# Patient Record
Sex: Female | Born: 1975 | Race: Black or African American | Hispanic: No | Marital: Married | Smoking: Current every day smoker
Health system: Southern US, Community
[De-identification: ages and names within clinical notes are randomized; demographics above are authoritative.]

## PROBLEM LIST (undated history)

## (undated) DIAGNOSIS — Z3046 Encounter for surveillance of implantable subdermal contraceptive: Secondary | ICD-10-CM

## (undated) DIAGNOSIS — Z319 Encounter for procreative management, unspecified: Secondary | ICD-10-CM

## (undated) DIAGNOSIS — K219 Gastro-esophageal reflux disease without esophagitis: Secondary | ICD-10-CM

## (undated) DIAGNOSIS — R87619 Unspecified abnormal cytological findings in specimens from cervix uteri: Secondary | ICD-10-CM

## (undated) DIAGNOSIS — I10 Essential (primary) hypertension: Secondary | ICD-10-CM

## (undated) HISTORY — DX: Unspecified abnormal cytological findings in specimens from cervix uteri: R87.619

## (undated) HISTORY — PX: NO PAST SURGERIES: SHX2092

## (undated) HISTORY — DX: Encounter for surveillance of implantable subdermal contraceptive: Z30.46

## (undated) HISTORY — DX: Gastro-esophageal reflux disease without esophagitis: K21.9

## (undated) HISTORY — DX: Encounter for procreative management, unspecified: Z31.9

---

## 2000-12-07 ENCOUNTER — Other Ambulatory Visit: Admission: RE | Admit: 2000-12-07 | Discharge: 2000-12-07 | Payer: Self-pay | Admitting: Obstetrics and Gynecology

## 2001-01-20 ENCOUNTER — Emergency Department (HOSPITAL_COMMUNITY): Admission: EM | Admit: 2001-01-20 | Discharge: 2001-01-20 | Payer: Self-pay | Admitting: Emergency Medicine

## 2001-07-06 ENCOUNTER — Encounter: Payer: Self-pay | Admitting: Family Medicine

## 2001-07-06 ENCOUNTER — Ambulatory Visit (HOSPITAL_COMMUNITY): Admission: RE | Admit: 2001-07-06 | Discharge: 2001-07-06 | Payer: Self-pay | Admitting: Family Medicine

## 2002-07-05 ENCOUNTER — Ambulatory Visit (HOSPITAL_COMMUNITY): Admission: RE | Admit: 2002-07-05 | Discharge: 2002-07-05 | Payer: Self-pay | Admitting: Unknown Physician Specialty

## 2002-07-05 ENCOUNTER — Encounter: Payer: Self-pay | Admitting: Family Medicine

## 2003-01-16 ENCOUNTER — Emergency Department (HOSPITAL_COMMUNITY): Admission: EM | Admit: 2003-01-16 | Discharge: 2003-01-16 | Payer: Self-pay | Admitting: Emergency Medicine

## 2003-01-30 ENCOUNTER — Emergency Department (HOSPITAL_COMMUNITY): Admission: EM | Admit: 2003-01-30 | Discharge: 2003-01-30 | Payer: Self-pay | Admitting: *Deleted

## 2004-06-18 ENCOUNTER — Emergency Department (HOSPITAL_COMMUNITY): Admission: EM | Admit: 2004-06-18 | Discharge: 2004-06-18 | Payer: Self-pay | Admitting: Emergency Medicine

## 2004-06-26 ENCOUNTER — Emergency Department (HOSPITAL_COMMUNITY): Admission: EM | Admit: 2004-06-26 | Discharge: 2004-06-26 | Payer: Self-pay | Admitting: Emergency Medicine

## 2004-09-18 ENCOUNTER — Ambulatory Visit: Payer: Self-pay | Admitting: Family Medicine

## 2005-11-11 ENCOUNTER — Emergency Department (HOSPITAL_COMMUNITY): Admission: EM | Admit: 2005-11-11 | Discharge: 2005-11-11 | Payer: Self-pay | Admitting: Emergency Medicine

## 2006-07-21 ENCOUNTER — Emergency Department (HOSPITAL_COMMUNITY): Admission: EM | Admit: 2006-07-21 | Discharge: 2006-07-21 | Payer: Self-pay | Admitting: Emergency Medicine

## 2006-09-17 ENCOUNTER — Ambulatory Visit (HOSPITAL_COMMUNITY): Admission: AD | Admit: 2006-09-17 | Discharge: 2006-09-17 | Payer: Self-pay | Admitting: Obstetrics and Gynecology

## 2006-12-23 ENCOUNTER — Ambulatory Visit (HOSPITAL_COMMUNITY): Admission: AD | Admit: 2006-12-23 | Discharge: 2006-12-23 | Payer: Self-pay | Admitting: Obstetrics and Gynecology

## 2007-01-11 ENCOUNTER — Ambulatory Visit (HOSPITAL_COMMUNITY): Admission: AD | Admit: 2007-01-11 | Discharge: 2007-01-11 | Payer: Self-pay | Admitting: Obstetrics & Gynecology

## 2007-01-26 ENCOUNTER — Encounter: Payer: Self-pay | Admitting: Obstetrics and Gynecology

## 2007-01-26 ENCOUNTER — Inpatient Hospital Stay (HOSPITAL_COMMUNITY): Admission: RE | Admit: 2007-01-26 | Discharge: 2007-01-27 | Payer: Self-pay | Admitting: Obstetrics and Gynecology

## 2007-06-17 ENCOUNTER — Ambulatory Visit: Payer: Self-pay | Admitting: Family Medicine

## 2007-11-02 ENCOUNTER — Ambulatory Visit: Payer: Self-pay | Admitting: Family Medicine

## 2007-11-05 ENCOUNTER — Ambulatory Visit (HOSPITAL_COMMUNITY): Admission: RE | Admit: 2007-11-05 | Discharge: 2007-11-05 | Payer: Self-pay | Admitting: Family Medicine

## 2007-11-05 ENCOUNTER — Encounter: Payer: Self-pay | Admitting: Family Medicine

## 2007-11-12 DIAGNOSIS — J309 Allergic rhinitis, unspecified: Secondary | ICD-10-CM | POA: Insufficient documentation

## 2007-11-12 DIAGNOSIS — M549 Dorsalgia, unspecified: Secondary | ICD-10-CM | POA: Insufficient documentation

## 2007-11-12 DIAGNOSIS — M25559 Pain in unspecified hip: Secondary | ICD-10-CM

## 2008-01-09 ENCOUNTER — Emergency Department (HOSPITAL_COMMUNITY): Admission: EM | Admit: 2008-01-09 | Discharge: 2008-01-09 | Payer: Self-pay | Admitting: Emergency Medicine

## 2009-02-06 ENCOUNTER — Ambulatory Visit: Payer: Self-pay | Admitting: Family Medicine

## 2009-02-08 LAB — CONVERTED CEMR LAB
BUN: 11 mg/dL (ref 6–23)
Basophils Absolute: 0 10*3/uL (ref 0.0–0.1)
Basophils Relative: 0 % (ref 0–1)
CO2: 18 meq/L — ABNORMAL LOW (ref 19–32)
Calcium: 9.6 mg/dL (ref 8.4–10.5)
Chloride: 102 meq/L (ref 96–112)
Cholesterol: 223 mg/dL — ABNORMAL HIGH (ref 0–200)
Creatinine, Ser: 0.87 mg/dL (ref 0.40–1.20)
Eosinophils Absolute: 0.3 10*3/uL (ref 0.0–0.7)
Eosinophils Relative: 3 % (ref 0–5)
Glucose, Bld: 72 mg/dL (ref 70–99)
HCT: 40.2 % (ref 36.0–46.0)
HDL: 65 mg/dL (ref >39)
Helicobacter Pylori Antibody-IgG: 1.1 — ABNORMAL HIGH
Hemoglobin: 13.6 g/dL (ref 12.0–15.0)
LDL Cholesterol: 148 mg/dL — ABNORMAL HIGH (ref 0–99)
Lymphocytes Relative: 34 % (ref 12–46)
Lymphs Abs: 2.6 10*3/uL (ref 0.7–4.0)
MCHC: 33.8 g/dL (ref 30.0–36.0)
MCV: 95.9 fL (ref 78.0–100.0)
Monocytes Absolute: 0.7 10*3/uL (ref 0.1–1.0)
Monocytes Relative: 9 % (ref 3–12)
Neutro Abs: 4.1 10*3/uL (ref 1.7–7.7)
Neutrophils Relative %: 53 % (ref 43–77)
Platelets: 332 10*3/uL (ref 150–400)
Potassium: 3.9 meq/L (ref 3.5–5.3)
RBC: 4.19 M/uL (ref 3.87–5.11)
RDW: 12.3 % (ref 11.5–15.5)
Sodium: 139 meq/L (ref 135–145)
TSH: 1.837 microintl units/mL (ref 0.350–4.500)
Total CHOL/HDL Ratio: 3.4
Triglycerides: 50 mg/dL (ref <150)
VLDL: 10 mg/dL (ref 0–40)
WBC: 7.6 10*3/uL (ref 4.0–10.5)

## 2009-02-14 ENCOUNTER — Encounter: Payer: Self-pay | Admitting: Family Medicine

## 2009-06-06 ENCOUNTER — Telehealth: Payer: Self-pay | Admitting: Family Medicine

## 2009-09-05 ENCOUNTER — Ambulatory Visit: Payer: Self-pay | Admitting: Family Medicine

## 2009-09-05 ENCOUNTER — Encounter (INDEPENDENT_AMBULATORY_CARE_PROVIDER_SITE_OTHER): Payer: Self-pay

## 2009-09-05 DIAGNOSIS — J209 Acute bronchitis, unspecified: Secondary | ICD-10-CM

## 2009-09-12 ENCOUNTER — Encounter: Payer: Self-pay | Admitting: Family Medicine

## 2009-12-07 ENCOUNTER — Encounter: Payer: Self-pay | Admitting: Family Medicine

## 2009-12-07 ENCOUNTER — Emergency Department (HOSPITAL_COMMUNITY): Admission: EM | Admit: 2009-12-07 | Discharge: 2009-12-07 | Payer: Self-pay | Admitting: Emergency Medicine

## 2010-09-07 ENCOUNTER — Encounter: Payer: Self-pay | Admitting: Family Medicine

## 2010-09-17 NOTE — Letter (Signed)
Summary: Out of Work  [facility]  [address]   [place], [place] [address]   Phone: [phone]  Fax: [phone]    September 05, 2009   Employee:  [name]    To Whom It May Concern:   For Medical reasons, please excuse the above named employee from work for the following dates:  Start:   09/05/2009  End:   09/07/2009  If you need additional information, please feel free to contact our office.         Sincerely,    Milus Mallick. Lodema Hong, M.D.

## 2010-09-17 NOTE — Progress Notes (Signed)
Summary: OBGYN  OBGYN   Imported By: Lind Guest 12/12/2009 16:36:06  _____________________________________________________________________  External Attachment:    Type:   Image     Comment:   External Document

## 2010-09-17 NOTE — Assessment & Plan Note (Signed)
Summary: office visit   Vital Signs:  Patient profile:   35 year old female Menstrual status:  regular Height:      66 inches Weight:      163.25 pounds BMI:     26.44 O2 Sat:      96 % Pulse rate:   91 / minute Pulse rhythm:   regular Resp:     16 per minute BP sitting:   110 / 70  Vitals Entered By: Everitt Amber (September 05, 2009 9:55 AM)  Nutrition Counseling: Patient's BMI is greater than 25 and therefore counseled on weight management options. CC: Coughing and now her chest is sore. Coughing up yellowish phlegm. Has been taking Mucinex and theraflu for 2 days but no better   CC:  Coughing and now her chest is sore. Coughing up yellowish phlegm. Has been taking Mucinex and theraflu for 2 days but no better.  History of Present Illness: Cough and chest congestion x  6 days, green sputum, and chills .  Smoking half pack per day.No quit date as yet. Has not been able to take prevpac due to cost. Prior to her acute illness she had been doing well.Reports  that they are doing well. . Denies sinus pressure, nasal congestion , ear pain or sore throat.  Denies chest pain, palpitations, PND, orthopnea or leg swelling. Denies nausea, vomitting, diarrhea or constipation. Denies change in bowel movements or bloody stool. Denies dysuria , frequency, incontinence or hesitancy. Denies  joint pain, swelling, or reduced mobility. Denies headaches, vertigo, seizures. Denies depression, anxiety or insomnia. Denies  rash, lesions, or itch.     Preventive Screening-Counseling & Management  Alcohol-Tobacco     Smoking Cessation Counseling: yes  Current Medications (verified): 1)  None  Allergies (verified): 1)  ! Sulfa  Review of Systems General:  Complains of chills, fatigue, and fever. ENT:  Denies hoarseness, nasal congestion, postnasal drainage, sinus pressure, and sore throat. Resp:  Complains of cough and sputum productive. GI:  Complains of abdominal pain; denies  constipation, diarrhea, nausea, and vomiting; untreated H pylori ds, meds were too expensive will prescribe lower cost alternative.  Physical Exam  General:  Well-developed,well-nourished,in no acute distress; alert,appropriate and cooperative throughout examination. Ill appearing HEENT: No facial asymmetry,  EOMI, No sinus tenderness, TM's Clear, oropharynx  pink and moist.   Chest: decreased air entry, bilateral crackles CVS: S1, S2, No murmurs, No S3.   Abd: Soft, Nontender.  MS: Adequate ROM spine, hips, shoulders and knees.  Ext: No edema.   CNS: CN 2-12 intact, power tone and sensation normal throughout.   Skin: Intact, no visible lesions or rashes.  Psych: Good eye contact, normal affect.  Memory intact, not anxious or depressed appearing.    Impression & Recommendations:  Problem # 1:  ACUTE BRONCHITIS (ICD-466.0) Assessment Comment Only  The following medications were removed from the medication list:    Flagyl 250 Mg Tabs (Metronidazole) .Marland Kitchen... Take 1 tablet by mouth four times a day    Tetracycline Hcl 500 Mg Caps (Tetracycline hcl) .Marland Kitchen... Take 1 tablet by mouth four times a day Her updated medication list for this problem includes:    Veetids 500 Mg Tabs (Penicillin v potassium) .Marland Kitchen... Take 1 tablet by mouth three times a day    Tessalon Perles 100 Mg Caps (Benzonatate) .Marland Kitchen... Take 1 tablet by mouth three times a day  Orders: Rocephin  250mg  (Z3086) Admin of Therapeutic Inj  intramuscular or subcutaneous (57846)  Problem # 2:  NICOTINE ADDICTION (ICD-305.1) Assessment: Unchanged  Encouraged smoking cessation and discussed different methods for smoking cessation.   Complete Medication List: 1)  Veetids 500 Mg Tabs (Penicillin v potassium) .... Take 1 tablet by mouth three times a day 2)  Tessalon Perles 100 Mg Caps (Benzonatate) .... Take 1 tablet by mouth three times a day 3)  Ibuprofen 800 Mg Tabs (Ibuprofen) .... Take 1 tablet by mouth three times a day 4)   Prevacid 30 Mg Cpdr (Lansoprazole) .... Take 1 tablet by mouth two times a day x 2 weeks  Patient Instructions: 1)  F/U in 5 months 2)  Tobacco is very bad for your health and your loved ones! You Should stop smoking!. 3)  Stop Smoking Tips: Choose a Quit date. Cut down before the Quit date. decide what you will do as a substitute when you feel the urge to smoke(gum,toothpick,exercise). 4)  It is important that you exercise regularly at least 20 minutes 5 times a week. If you develop chest pain, have severe difficulty breathing, or feel very tired , stop exercising immediately and seek medical attention. 5)  You need to lose weight. Consider a lower calorie diet and regular exercise.  6)  You are being treated for acute bronchirtis and h pylori disease. Meds are sent in Prescriptions: PREVACID 30 MG CPDR (LANSOPRAZOLE) Take 1 tablet by mouth two times a day x 2 weeks  #18 x 0   Entered and Authorized by:   Syliva Overman MD   Signed by:   Syliva Overman MD on 09/10/2009   Method used:   Samples Given   RxID:   [id number] IBUPROFEN 800 MG TABS (IBUPROFEN) Take 1 tablet by mouth three times a day  #90 x 2   Entered by:   Everitt Amber   Authorized by:   Syliva Overman MD   Signed by:   Everitt Amber on 09/05/2009   Method used:   [facility] [address] [phone]* (retail)       [address]       [place], [place]  [address]       Ph: [phone] or [phone]       Fax: [phone]   RxID:   [id number] TESSALON PERLES 100 MG CAPS (BENZONATATE) Take 1 tablet by mouth three times a day  #30 x 0   Entered and Authorized by:   Syliva Overman MD   Signed by:   Syliva Overman MD on 09/05/2009   Method used:   [facility]ronically to        [facility] [phone]* (retail)       [address]       [place], [place]  [address]       Ph: [phone] or [phone]       Fax: [phone]   RxID:   [id number] VEETIDS 500 MG TABS  (PENICILLIN V POTASSIUM) Take 1 tablet by mouth three times a day  #42 x 0   Entered and Authorized by:   Syliva Overman MD   Signed by:   Syliva Overman MD on 09/05/2009   Method used:   Electronically to        [facility] [phone]* (retail)       [address]       [place], [place]  [address]       Ph: [phone] or [phone]       Fax: [phone]   RxID:   [id number]    Medication Administration  Injection # 1:    Medication: Rocephin  250mg     Diagnosis: ACUTE BRONCHITIS (ICD-466.0)    Route: IM    Site: RUOQ gluteus    Exp Date: 04/2011    Lot #: [id number]    Mfr: sandoz    Comments: 500 mg given     Patient tolerated injection without complications    Given by: Everitt Amber (September 05, 2009 12:48 PM)  Orders Added: 1)  Est. Patient Level IV [59563] 2)  Rocephin  250mg  [J0696] 3)  Admin of Therapeutic Inj  intramuscular or subcutaneous [87564]

## 2010-09-17 NOTE — Miscellaneous (Signed)
Clinical Lists Changes  Medications: Added new medication of FLAGYL 250 MG TABS (METRONIDAZOLE) one tab by mouth qid - Signed Added new medication of TETRACYCLINE HCL 500 MG CAPS (TETRACYCLINE HCL) once cap by mouth qid - Signed Added new medication of BISMUTH SUBGALLATE 324 MG TABS (BISMUTH SUBGALLATE) two tabs by mouth qid - Signed Added new medication of RANITIDINE HCL 150 MG TABS (RANITIDINE HCL) one tab by mouth bid - Signed Rx of FLAGYL 250 MG TABS (METRONIDAZOLE) one tab by mouth qid;  #56 x 0;  Signed;  Entered by: Worthy Keeler LPN;  Authorized by: Syliva Overman MD;  Method used: Electronically to [facility] [phone]*, [address] [place], [place], [place]  [address], Ph: [phone] or [phone], Fax: [phone] Rx of TETRACYCLINE HCL 500 MG CAPS (TETRACYCLINE HCL) once cap by mouth qid;  #56 x 0;  Signed;  Entered by: Worthy Keeler LPN;  Authorized by: Syliva Overman MD;  Method used: Electronically to [facility] [phone]*, [address] [place], [place], [place]  [address], Ph: [phone] or [phone], Fax: [phone] Rx of BISMUTH SUBGALLATE 324 MG TABS (BISMUTH SUBGALLATE) two tabs by mouth qid;  #112 x 0;  Signed;  Entered by: Worthy Keeler LPN;  Authorized by: Syliva Overman MD;  Method used: Electronically to [facility] [phone]*, [address], [place], [place], [place]  [address], Ph: [phone] or [phone], Fax: [phone] Rx of RANITIDINE HCL 150 MG TABS (RANITIDINE HCL) one tab by mouth bid;  #60 x 0;  Signed;  Entered by: Worthy Keeler LPN;  Authorized by: Syliva Overman MD;  Method used: Electronically to [facility] [phone]*, [address], [place], [place], [place]  [address], Ph: [phone] or [phone], Fax: [phone]    Prescriptions: RANITIDINE HCL 150 MG TABS (RANITIDINE HCL) one tab by mouth bid  #60 x 0   Entered by:   Worthy Keeler LPN   Authorized by:   Syliva Overman MD   Signed by:   Worthy Keeler LPN on 06/11/8526   Method  used:   [facility]ronically [facility]        [facility] [address] [phone]* (retail)       [address]       [place], [place]  [address]       Ph: [phone] or [phone]       Fax: [phone]   RxID:   [id number] BISMUTH SUBGALLATE 324 MG TABS (BISMUTH SUBGALLATE) two tabs by mouth qid  #112 x 0   Entered by:   Worthy Keeler LPN   Authorized by:   Syliva Overman MD   Signed by:   Worthy Keeler LPN on 93/79/0240   Method used:   [facility]ronically to        [facility] [address] [phone]* (retail)       [address]       [place], [place]  [address]       Ph: [phone] or [phone]       Fax: [phone]   RxID:   [id number] TETRACYCLINE HCL 500 MG CAPS (TETRACYCLINE HCL) once cap by mouth qid  #56 x 0   Entered by:   Worthy Keeler LPN   Authorized by:   Syliva Overman MD   Signed by:   Worthy Keeler LPN on 58/85/0277   Method used:   Electronically to  [facility]  [address] [phone]* (retail)       [address]       [place], [place]  [address]       Ph: [phone] or [phone]       Fax: [phone]   RxID:   [id number] FLAGYL 250 MG TABS (METRONIDAZOLE) one tab by mouth qid  #56 x 0   Entered by:   Worthy Keeler LPN   Authorized by:   Syliva Overman MD   Signed by:   Worthy Keeler LPN on 95/63/8756   Method used:   Electronically to        [facility]. [phone]* (retail)       [address]       [place], [place]  [address]       Ph: [phone] or [phone]       Fax: [phone]   RxID:   [id number]

## 2010-12-31 NOTE — Op Note (Signed)
[name], [name]                ACCOUNT NO.:  [id number]   MEDICAL RECORD NO.:  [id number]          PATIENT TYPE:  INP   LOCATION:  LDR3                          FACILITY:  [facility]   PHYSICIAN:  Tilda Burrow, M.D. DATE OF BIRTH:  1976-04-24   DATE OF PROCEDURE:  DATE OF DISCHARGE:                                PROCEDURE NOTE   DELIVERY SUMMARY   ONSET OF LABOR:  January 26, 2007, at 4 a.m.   DATE OF DELIVERY:  January 26, 2007, at 6:50 a.m.   LENGTH OF FIRST STAGE OF LABOR:  4 hours 40 minutes   LENGTH OF SECOND STAGE OF LABOR:  10 minutes   LENGTH OF THIRD STAGE OF LABOR:  5 minutes   DELIVERY NOTE:  [name] had a normal spontaneous vaginal delivery.  Upon  rupture of membranes prior to delivery there was light meconium noted.  Upon delivery of head, attempted to DeLee suction but mother was  uncooperative and there was spontaneous delivery of fetus without  complication.  Apgars were 9 and 9.  At that time the infant was  thoroughly suctioned with the DeLee, cord clamped and cut, and infant to  newborn warmer for nursery care.  Perineum is noted to be intact.  Cord  blood gas and cord blood were obtained.  Placenta was delivered  spontaneously via Schultze mechanism.  Three-vessel cord is noted.  Estimated blood loss, again, 250 mL.  The patient and infant stabilized  and transferred out to the postpartum unit in stable condition.      Zerita Boers, [name]      Tilda Burrow, M.D.  Electronically Signed   DL/MEDQ  D:  16/05/9603  T:  01/26/2007  Job:  [id number]   cc:   Jeoffrey Massed, MD  Fax: [phone]   [facility]

## 2010-12-31 NOTE — H&P (Signed)
[name], RAMAKER                ACCOUNT NO.:  [id number]   MEDICAL RECORD NO.:  [id number]          PATIENT TYPE:  INP   LOCATION:  LDR3                          FACILITY:  [facility]   PHYSICIAN:  Tilda Burrow, M.D. DATE OF BIRTH:  March 30, 1976   DATE OF ADMISSION:  01/26/2007  DATE OF DISCHARGE:  LH                              HISTORY & PHYSICAL   REASON FOR ADMISSION:  Pregnancy at 38 weeks, in active labor.   MEDICAL HISTORY:  Negative.   SURGICAL HISTORY:  Negative.   ALLERGIES:  She has no known allergies.   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  She also has a  history of cleft lip in a previous pregnancy.  Blood type is B negative.  UDS positive for THC.  Rubella is equivocal.  Hepatitis B surface  antigen negative.  HIV is negative.  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  Serology  nonreactive on both draws.  GC and chlamydia positive with repeat  negative at 28 weeks.  Hemoglobin 9.8 at 28 weeks, hematocrit 28.7,  which responded well to iron therapy, and her hemoglobin came up to 11.5  and hematocrit 33.  One-hour glucose was 147.   PLAN:  We are going to admit.  Expect vaginal delivery since is 7-8 cm  in labor.      Zerita Boers, [name]      Tilda Burrow, M.D.  Electronically Signed    DL/MEDQ  D:  16/05/9603  T:  01/26/2007  Job:  [id number]   cc:   Jeoffrey Massed, MD  Fax: [phone]

## 2011-01-03 NOTE — H&P (Signed)
[name], [name]                ACCOUNT NO.:  [id number]   MEDICAL RECORD NO.:  [id number]          PATIENT TYPE:  OIB   LOCATION:  A415                          FACILITY:  [facility]   PHYSICIAN:  Tilda Burrow, M.D. DATE OF BIRTH:  Aug 08, 1976   DATE OF ADMISSION:  12/23/2006  DATE OF DISCHARGE:  LH                              HISTORY & PHYSICAL   ADMITTING DIAGNOSES:  1. Pregnancy [redacted] weeks gestation.  2. Uterine irritability rule out preterm labor   HISTORY OF PRESENT ILLNESS:  This 35 year old female gravida 4, para 2,  AB 1 due June 24 by menstrual history; and June 18 and June 22 by first  and second trimester ultrasounds was admitted after her pregnancy course  follow through our office.  She is working approximately 20 to 30 hours  per week at [employer].  She has been having some abdominal discomfort  while at work; leading in the gush of fluid.  This morning she woke up  having some mild-to-moderate discomfort and presents to labor and  delivery where external monitor shows a regular uterine pattern of  contractions of every four minutes a mild-to- moderate intensity with  cervical exam showing the cervix still to remain closed.  We started IV  fluids and we will give her a single dose of terbutaline subcu to knock  out the contractions and see how she does through the morning.  The  urine drug screen is performed and is negative for opiates, cocaine,  benzodiazepine, and amphetamines, remains constant for THC.  Urinalysis  is pending at this time.  She denies UTI symptoms.  Plan is for  observation, IV fluid hydration, and consideration of discharge home of  contractions resolve promptly.  Will likely require being out of work  for the remainder of the we will follow up appointment next Monday, at  which time we can discuss further work restrictions.  The patient  desires to work this possible.   ASSESSMENT:  Pregnancy at 33 weeks, preterm labor with no cervical  changes.   PLAN:  See orders.      Tilda Burrow, M.D.  Electronically Signed     JVF/MEDQ  D:  12/23/2006  T:  12/23/2006  Job:  [id number]

## 2011-01-03 NOTE — Group Therapy Note (Signed)
NAMEANELISE, [name]                ACCOUNT NO.:  [id number]   MEDICAL RECORD NO.:  [id number]          PATIENT TYPE:  OIB   LOCATION:  LDR3                          FACILITY:  [facility]   PHYSICIAN:  Tilda Burrow, M.D. DATE OF BIRTH:  1975/12/20   DATE OF PROCEDURE:  DATE OF DISCHARGE:                                 PROGRESS NOTE   [name] is [redacted] weeks pregnant with her third child.  She was seen in the  office today with complaints of some vaginal bleeding.  At that time she  did not have any bleeding in her vagina or signs of infection per Dr.  _eure___ .  She went shopping and started having bleeding again.  It was  enough to soak through her jeans.  A sterile speculum exam reveals a  moderate amount of bright red to dark blood in the vaginal vault.  The  cervix looks just fine.  No evidence of infection.  Dr. Emelda Fear  performed a BEDSIDE TRANSVAGINAL ULTRASOUND   which revealed an anterior low-lying placenta WITHOUT evidence of  retroplacental clots, but revealing a marginal just above the internal  os , most likely the source of the bleeding. The cervix is closed with a  4.6 cm cervical length.   The fetus is very active and no uterine activity is noted.  The patient  is going to receive a dose of RhoGAM due to her B-  Status and she was discharged home.  She is instructed to refrain from  sexual activity until further notice and if she starts bleeding again to  rest, and if she has  bleeding equal to or more than a period to follow  up at labor and delivery or our office.      Jacklyn Shell, C.N.M.      Tilda Burrow, M.D.  Electronically Signed    FC/MEDQ  D:  09/17/2006  T:  09/17/2006  Job:  [id number]

## 2011-01-03 NOTE — Discharge Summary (Signed)
NAMEGENEVIE, ELMAN                ACCOUNT NO.:  [id number]   MEDICAL RECORD NO.:  [id number]          PATIENT TYPE:  OIB   LOCATION:  A415                          FACILITY:  [facility]   PHYSICIAN:  Tilda Burrow, M.D. DATE OF BIRTH:  12-Oct-1975   DATE OF ADMISSION:  12/23/2006  DATE OF DISCHARGE:  05/07/2008LH                               DISCHARGE SUMMARY   DATE OF ADMISSION:  Dec 23, 2006, at 8:30.   DATE OF DISCHARGE:  Dec 23, 2006, at 12:30 p.m.   After observation for approximately 5-6 hours, Lucy is being  discharged home.  Her abdominal pain has resolved with hydration. She  was dehydrated with evaluation of her UA.  With hydration, the abdominal  pain has resolved.  Cervix is closed, long, posterior, and high.   PLAN:  We are going to discharge her home.  She is to follow up p.r.n.  or in the office Friday.   ADMISSION DIAGNOSES:  1. Preterm contractions.  2. Dehydration.   DISCHARGE DIAGNOSES:  1. Preterm contractions.  2. Dehydration.      Zerita Boers, [name]      Tilda Burrow, M.D.  Electronically Signed    DL/MEDQ  D:  45/40/9811  T:  12/23/2006  Job:  [id number]   cc:   [facility]

## 2011-06-05 LAB — URINALYSIS, ROUTINE W REFLEX MICROSCOPIC
Bilirubin Urine: NEGATIVE
Glucose, UA: NEGATIVE
Hgb urine dipstick: NEGATIVE
Ketones, ur: NEGATIVE
Nitrite: NEGATIVE
Protein, ur: NEGATIVE
Specific Gravity, Urine: 1.02
Urobilinogen, UA: 2 — ABNORMAL HIGH
pH: 6.5

## 2011-06-05 LAB — RAPID URINE DRUG SCREEN, HOSP PERFORMED
Benzodiazepines: NOT DETECTED
Cocaine: NOT DETECTED
Tetrahydrocannabinol: POSITIVE — AB

## 2011-06-05 LAB — CBC
HCT: 33.2 — ABNORMAL LOW
Hemoglobin: 11.6 — ABNORMAL LOW
MCHC: 35
MCV: 94.8
RBC: 3.51 — ABNORMAL LOW
RDW: 12.9

## 2011-06-05 LAB — CORD BLOOD GAS (ARTERIAL)
Acid-base deficit: 0.9
Bicarbonate: 24.1 — ABNORMAL HIGH
TCO2: 21.5
pCO2 cord blood (arterial): 46
pH cord blood (arterial): 7.339
pO2 cord blood: 27.2

## 2011-06-05 LAB — DIFFERENTIAL
Basophils Absolute: 0
Eosinophils Relative: 1
Lymphocytes Relative: 26
Neutro Abs: 6.9
Neutrophils Relative %: 68

## 2011-06-05 LAB — RH IMMUNE GLOB WKUP(>/=20WKS)(NOT WOMEN'S HOSP)
Antibody Screen: POSITIVE
DAT, IgG: NEGATIVE
Fetal Screen: NEGATIVE

## 2012-09-17 ENCOUNTER — Emergency Department (HOSPITAL_COMMUNITY): Payer: Self-pay

## 2012-09-17 ENCOUNTER — Encounter (HOSPITAL_COMMUNITY): Payer: Self-pay | Admitting: *Deleted

## 2012-09-17 ENCOUNTER — Emergency Department (HOSPITAL_COMMUNITY)
Admission: EM | Admit: 2012-09-17 | Discharge: 2012-09-17 | Disposition: A | Discharge status: Home / Self Care | Payer: Self-pay | Attending: Emergency Medicine | Admitting: Emergency Medicine

## 2012-09-17 DIAGNOSIS — M1610 Unilateral primary osteoarthritis, unspecified hip: Secondary | ICD-10-CM | POA: Insufficient documentation

## 2012-09-17 DIAGNOSIS — M169 Osteoarthritis of hip, unspecified: Secondary | ICD-10-CM | POA: Insufficient documentation

## 2012-09-17 DIAGNOSIS — Z79899 Other long term (current) drug therapy: Secondary | ICD-10-CM | POA: Insufficient documentation

## 2012-09-17 DIAGNOSIS — F17200 Nicotine dependence, unspecified, uncomplicated: Secondary | ICD-10-CM | POA: Insufficient documentation

## 2012-09-17 DIAGNOSIS — M1990 Unspecified osteoarthritis, unspecified site: Secondary | ICD-10-CM

## 2012-09-17 DIAGNOSIS — Z3202 Encounter for pregnancy test, result negative: Secondary | ICD-10-CM | POA: Insufficient documentation

## 2012-09-17 LAB — POCT PREGNANCY, URINE: Preg Test, Ur: NEGATIVE

## 2012-09-17 MED ORDER — IBUPROFEN 600 MG PO TABS: 600.0000 mg | ORAL_TABLET | Freq: Four times a day (QID) | ORAL | Status: AC | PRN

## 2012-09-17 MED ORDER — HYDROCODONE-ACETAMINOPHEN 5-325 MG PO TABS
1.0000 | ORAL_TABLET | Freq: Once | ORAL | Status: AC
  Administered 2012-09-17: 1 via ORAL
  Filled 2012-09-17: qty 1

## 2012-09-17 MED ORDER — HYDROCODONE-ACETAMINOPHEN 5-325 MG PO TABS: 1.0000 | ORAL_TABLET | ORAL | Status: AC | PRN

## 2012-09-17 NOTE — ED Provider Notes (Signed)
Medical screening examination/treatment/procedure(s) were performed by non-physician practitioner and as supervising physician I was immediately available for consultation/collaboration. Devoria Albe, MD, Armando Gang   Ward Givens, MD 09/17/12 [phone]

## 2012-09-17 NOTE — ED Provider Notes (Signed)
History     CSN: [id number]  Arrival date & time 09/17/12  0904   First MD Initiated Contact with Patient 09/17/12 [phone]      Chief Complaint  Patient presents with  . Pelvic Pain    (Consider location/radiation/quality/duration/timing/severity/associated sxs/prior treatment) HPI Comments: [name] is a 37 y.o. Female presenting with intermittent pelvic pain which she experience since her last pregnancy 5 years ago during cold weather only.  She denies any injuries or falls.  Pain is constant and aching for the past week and is worse with movement and ambulation.  She denies weakness or numbness lower extremities denies low back pain.  She also has had no vaginal discharge, no abdominal or lower pelvic pain, no dysuria.  She has taken her mother's 800 mg ibuprofen since yesterday without relief of her symptoms.       The history is provided by the patient.    History reviewed. No pertinent past medical history.  History reviewed. No pertinent past surgical history.  No family history on file.  History  Substance Use Topics  . Smoking status: Current Every Day Smoker  . Smokeless tobacco: Not on file  . Alcohol Use: Yes    OB History    Grav Para Term Preterm Abortions TAB SAB Ect Mult Living                  Review of Systems  Musculoskeletal: Positive for arthralgias. Negative for back pain and joint swelling.  Skin: Negative for wound.  Neurological: Negative for weakness and numbness.    Allergies  Review of patient's allergies indicates no known allergies.  Home Medications   Current Outpatient Rx  Name  Route  Sig  Dispense  Refill  . ETONOGESTREL 68 MG [place] IMPL   Subcutaneous   Inject 1 each into the skin once.         . IBUPROFEN 800 MG PO TABS   Oral   Take 800 mg by mouth every 8 (eight) hours as needed. Pain         . HYDROCODONE-ACETAMINOPHEN 5-325 MG PO TABS   Oral   Take 1 tablet by mouth every 4 (four) hours as needed for pain.  20 tablet   0   . IBUPROFEN 600 MG PO TABS   Oral   Take 1 tablet (600 mg total) by mouth every 6 (six) hours as needed for pain.   20 tablet   0     BP 113/64  Pulse 71  Temp 98.5 F (36.9 C)  Resp 20  Ht 5\' 6"  (1.676 m)  Wt 170 lb (77.111 kg)  BMI 27.44 kg/m2  SpO2 99%  LMP 07/17/2012  Physical Exam  Constitutional: She appears well-developed and well-nourished.  HENT:  Head: Atraumatic.  Neck: Normal range of motion.  Cardiovascular:  Pulses:      Dorsalis pedis pulses are 2+ on the right side, and 2+ on the left side.       Pulses equal bilaterally  Musculoskeletal: She exhibits tenderness.       Tender to palpation bilateral anterior groin and across mons pubis.  No rash, edema, skin appears healthy.  No crepitus with range of motion bilateral hips.  Neurological: She is alert. She has normal strength. She displays normal reflexes. No sensory deficit.       Equal strength  Skin: Skin is warm and dry.  Psychiatric: She has a normal mood and affect.    ED Course  Procedures (including critical care time)   Labs Reviewed  POCT PREGNANCY, URINE   Dg Hip Bilateral W/pelvis  09/17/2012  *RADIOLOGY REPORT*  Clinical Data: Pelvic pain, no known injury  BILATERAL HIP WITH PELVIS - 4+ VIEW  Comparison: None.  Findings: Five views bilateral hip submitted.  No acute fracture or subluxation.  Bilateral hip joint is preserved.  Mild degenerative changes pubic symphysis.  IMPRESSION: No acute fracture or subluxation.  Mild degenerative changes pubic symphysis.   Original Report Authenticated By: Natasha Mead, M.D.      1. Osteoarthritis       MDM  Patient prescribed ibuprofen and hydrocodone.  Also encouraged heating pad or warm tub soaks several times daily.  Encouraged followup with PCP.  Also given referral to orthopedics given degenerative changes, further management pending orthopedic evaluation.       Burgess Amor, [place] 09/17/12 1137

## 2012-09-17 NOTE — ED Notes (Signed)
Pt states bilateral hip pain, which is chronic. Flare up began one week ago. Pt states unable to go to PMD due to loss of insurance. NAD. Pt ambulated to restroom.

## 2012-09-17 NOTE — ED Notes (Signed)
Pt c/o pain to pelvic bone, states "it always bother's me when it gets cold", denies any injury, states that the pain started getting worse about a week ago, pain is increased with movement, has been seen in past before by Dr. Lodema Hong and given pain medication for relief,

## 2013-03-08 ENCOUNTER — Ambulatory Visit (INDEPENDENT_AMBULATORY_CARE_PROVIDER_SITE_OTHER): Payer: Self-pay | Admitting: Advanced Practice Midwife

## 2013-03-08 ENCOUNTER — Encounter: Payer: Self-pay | Admitting: Advanced Practice Midwife

## 2013-03-08 VITALS — BP 140/82 | Ht 65.5 in | Wt 177.0 lb

## 2013-03-08 DIAGNOSIS — Z3046 Encounter for surveillance of implantable subdermal contraceptive: Secondary | ICD-10-CM

## 2013-03-08 DIAGNOSIS — Z3202 Encounter for pregnancy test, result negative: Secondary | ICD-10-CM

## 2013-03-08 DIAGNOSIS — Z30017 Encounter for initial prescription of implantable subdermal contraceptive: Secondary | ICD-10-CM

## 2013-03-08 LAB — POCT URINE PREGNANCY: Preg Test, Ur: NEGATIVE

## 2013-03-08 NOTE — Progress Notes (Signed)
[name] 37 y.o.  This will be her 3rd Nexplanon.  Is ammenorrheic.  She is waiting for her 83 yo son's 26 yo "one night stand in the school bathroom" to have a baby that could be his. Otherwise, she is doing fine, she and her husband are happy with Nexplanon.  Cruz Guastella was given informed consent for removal of her Implanon, time out was performed.  Signed copy in the chart.  Appropriate time out taken. Implanon site identified.  Area prepped in usual sterile fashon. 2 cc of 1% lidocaine was used to anesthetize the area starting with the distal end of the implant. A small stab incision was made right beside the implant on the distal portion.  The implanon rod was grasped using hemostats and removed without difficulty.  There was less than 3 cc blood loss. There were no complications. Next, the area was cleansed again and the new Nexplanon was inserted without difficulty.  Steri strips and a pressure bandage were applied.  Pt was instructed to remove pressure bandage in a few hours, and keep insertion site covered with a bandaid for 3 days.  Follow-up scheduled PRN problems  CRESENZO-DISHMAN,Cadell Gabrielson 03/08/2013 9:44 AM

## 2015-10-14 ENCOUNTER — Encounter (HOSPITAL_COMMUNITY): Payer: Self-pay | Admitting: Emergency Medicine

## 2015-10-14 ENCOUNTER — Emergency Department (HOSPITAL_COMMUNITY): Payer: BLUE CROSS/BLUE SHIELD

## 2015-10-14 ENCOUNTER — Emergency Department (HOSPITAL_COMMUNITY)
Admission: EM | Admit: 2015-10-14 | Discharge: 2015-10-14 | Disposition: A | Discharge status: Home / Self Care | Payer: BLUE CROSS/BLUE SHIELD | Attending: Emergency Medicine | Admitting: Emergency Medicine

## 2015-10-14 DIAGNOSIS — Y998 Other external cause status: Secondary | ICD-10-CM | POA: Diagnosis not present

## 2015-10-14 DIAGNOSIS — W1839XA Other fall on same level, initial encounter: Secondary | ICD-10-CM | POA: Diagnosis not present

## 2015-10-14 DIAGNOSIS — F17200 Nicotine dependence, unspecified, uncomplicated: Secondary | ICD-10-CM | POA: Insufficient documentation

## 2015-10-14 DIAGNOSIS — Y9354 Activity, bowling: Secondary | ICD-10-CM | POA: Diagnosis not present

## 2015-10-14 DIAGNOSIS — Y9239 Other specified sports and athletic area as the place of occurrence of the external cause: Secondary | ICD-10-CM | POA: Diagnosis not present

## 2015-10-14 DIAGNOSIS — S93402A Sprain of unspecified ligament of left ankle, initial encounter: Secondary | ICD-10-CM | POA: Diagnosis not present

## 2015-10-14 DIAGNOSIS — S99911A Unspecified injury of right ankle, initial encounter: Secondary | ICD-10-CM | POA: Diagnosis present

## 2015-10-14 DIAGNOSIS — S9032XA Contusion of left foot, initial encounter: Secondary | ICD-10-CM | POA: Insufficient documentation

## 2015-10-14 MED ORDER — HYDROCODONE-ACETAMINOPHEN 5-325 MG PO TABS: 1.0000 | ORAL_TABLET | ORAL | Status: DC | PRN

## 2015-10-14 MED ORDER — IBUPROFEN 800 MG PO TABS
800.0000 mg | ORAL_TABLET | Freq: Once | ORAL | Status: DC
  Filled 2015-10-14: qty 1

## 2015-10-14 MED ORDER — HYDROCODONE-ACETAMINOPHEN 5-325 MG PO TABS
1.0000 | ORAL_TABLET | Freq: Once | ORAL | Status: AC
  Administered 2015-10-14: 1 via ORAL
  Filled 2015-10-14: qty 1

## 2015-10-14 MED ORDER — IBUPROFEN 600 MG PO TABS: 600.0000 mg | ORAL_TABLET | Freq: Four times a day (QID) | ORAL | Status: DC | PRN

## 2015-10-14 NOTE — ED Notes (Signed)
Pt verbalized understanding of no driving and to use caution within 4 hours of taking pain meds due to meds cause drowsiness 

## 2015-10-14 NOTE — ED Notes (Signed)
Patient states she fell last night twisting her left ankle. Complaining of pain to left ankle since injury.

## 2015-10-14 NOTE — Discharge Instructions (Signed)
Your x-ray is negative for fracture or dislocation. Your examination favors ankle sprain. Please use the ankle splint for the next 10 days. Use crutches until you're able to safely apply weight to your left ankle. Please see Dr. Romeo Apple for additional orthopedic evaluation if not improving. Use ibuprofen 4 times daily or every 6 hours with food. May use Norco for more severe pain. Norco may cause drowsiness, please use this medication with caution. Ankle Sprain An ankle sprain is an injury to the strong, fibrous tissues (ligaments) that hold the bones of your ankle joint together.  CAUSES An ankle sprain is usually caused by a fall or by twisting your ankle. Ankle sprains most commonly occur when you step on the outer edge of your foot, and your ankle turns inward. People who participate in sports are more prone to these types of injuries.  SYMPTOMS   Pain in your ankle. The pain may be present at rest or only when you are trying to stand or walk.  Swelling.  Bruising. Bruising may develop immediately or within 1 to 2 days after your injury.  Difficulty standing or walking, particularly when turning corners or changing directions. DIAGNOSIS  Your caregiver will ask you details about your injury and perform a physical exam of your ankle to determine if you have an ankle sprain. During the physical exam, your caregiver will press on and apply pressure to specific areas of your foot and ankle. Your caregiver will try to move your ankle in certain ways. An X-ray exam may be done to be sure a bone was not broken or a ligament did not separate from one of the bones in your ankle (avulsion fracture).  TREATMENT  Certain types of braces can help stabilize your ankle. Your caregiver can make a recommendation for this. Your caregiver may recommend the use of medicine for pain. If your sprain is severe, your caregiver may refer you to a surgeon who helps to restore function to parts of your skeletal system  (orthopedist) or a physical therapist. HOME CARE INSTRUCTIONS   Apply ice to your injury for 1-2 days or as directed by your caregiver. Applying ice helps to reduce inflammation and pain.  Put ice in a plastic bag.  Place a towel between your skin and the bag.  Leave the ice on for 15-20 minutes at a time, every 2 hours while you are awake.  Only take over-the-counter or prescription medicines for pain, discomfort, or fever as directed by your caregiver.  Elevate your injured ankle above the level of your heart as much as possible for 2-3 days.  If your caregiver recommends crutches, use them as instructed. Gradually put weight on the affected ankle. Continue to use crutches or a cane until you can walk without feeling pain in your ankle.  If you have a plaster splint, wear the splint as directed by your caregiver. Do not rest it on anything harder than a pillow for the first 24 hours. Do not put weight on it. Do not get it wet. You may take it off to take a shower or bath.  You may have been given an elastic bandage to wear around your ankle to provide support. If the elastic bandage is too tight (you have numbness or tingling in your foot or your foot becomes cold and blue), adjust the bandage to make it comfortable.  If you have an air splint, you may blow more air into it or let air out to make it more  comfortable. You may take your splint off at night and before taking a shower or bath. Wiggle your toes in the splint several times per day to decrease swelling. SEEK MEDICAL CARE IF:   You have rapidly increasing bruising or swelling.  Your toes feel extremely cold or you lose feeling in your foot.  Your pain is not relieved with medicine. SEEK IMMEDIATE MEDICAL CARE IF:  Your toes are numb or blue.  You have severe pain that is increasing. MAKE SURE YOU:   Understand these instructions.  Will watch your condition.  Will get help right away if you are not doing well or get  worse.   This information is not intended to replace advice given to you by your health care provider. Make sure you discuss any questions you have with your health care provider.   Document Released: 08/04/2005 Document Revised: 08/25/2014 Document Reviewed: 08/16/2011 Elsevier Interactive Patient Education [employer]

## 2015-10-14 NOTE — ED Provider Notes (Signed)
CSN: [id number]     Arrival date & time 10/14/15  1403 History  By signing my name below, I, [name], attest that this documentation has been prepared under the direction and in the presence of Ivery Quale, PA-C. Electronically Signed: Ronney [name], ED Scribe. 10/14/2015. 3:09 PM.    Chief Complaint  Patient presents with  . Ankle Injury   Patient is a 40 y.o. female presenting with lower extremity injury. The history is provided by the patient. No language interpreter was used.  Ankle Injury This is a new problem. The current episode started 12 to 24 hours ago. The problem occurs constantly. The problem has not changed since onset.Pertinent negatives include no chest pain, no abdominal pain, no headaches and no shortness of breath. Nothing aggravates the symptoms. Nothing relieves the symptoms. She has tried nothing for the symptoms.   HPI Comments: [name] is a 40 y.o. female who presents to the Emergency Department complaining of sudden-onset, 8/10, aching left ankle pain that began last night after falling and twisting her left ankle while bowling. She also complains of associated swelling. She denies being on any anticoagulation. Bearing weight exacerbates her pain. Patient tried elevating her ankle with minimal relief. Patient has NKDA. She denies the possibility of pregnancy and is not currently breastfeeding. She denies any left knee pain.  History reviewed. No pertinent past medical history. History reviewed. No pertinent past surgical history. History reviewed. No pertinent family history. Social History  Substance Use Topics  . Smoking status: Current Every Day Smoker  . Smokeless tobacco: None  . Alcohol Use: Yes     Comment: "a glass of wine daily"   OB History    No data available     Review of Systems  Respiratory: Negative for shortness of breath.   Cardiovascular: Negative for chest pain.  Gastrointestinal: Negative for abdominal pain.  Musculoskeletal:  Positive for joint swelling and arthralgias (left ankle pain).  Neurological: Negative for headaches.   Allergies  Review of patient's allergies indicates no known allergies.  Home Medications   Prior to Admission medications   Medication Sig Start Date End Date Taking? Authorizing Provider  etonogestrel (IMPLANON) 68 MG IMPL implant Inject 1 each into the skin once.    Historical Provider, MD  ibuprofen (ADVIL,MOTRIN) 800 MG tablet Take 800 mg by mouth every 8 (eight) hours as needed. Pain    Historical Provider, MD   BP 146/85 mmHg  Pulse 81  Temp(Src) 97.9 F (36.6 C) (Oral)  Resp 18  Ht  (1.676 m)  Wt 175 lb (79.379 kg)  BMI 28.26 kg/m2  SpO2 99%  LMP 09/13/2015 Physical Exam  Constitutional: She is oriented to person, place, and time. She appears well-developed and well-nourished. No distress.  HENT:  Head: Normocephalic and atraumatic.  Eyes: Conjunctivae and EOM are normal.  Neck: Neck supple. No tracheal deviation present.  Cardiovascular: Normal rate.   Pulmonary/Chest: Effort normal. No respiratory distress.  Musculoskeletal: Normal range of motion. She exhibits tenderness.  There is no effusion of the left knee. There is no deformity of the tibial area. The Achilles' tendon is intact. Capillary refill is less than 2 seconds. FROM of the toes. Tenderness to the lateral malleolus, with moderate swelling. There is a bruise area at the fifth tarsal bone.   Neurological: She is alert and oriented to person, place, and time.  Skin: Skin is warm and dry.  Psychiatric: She has a normal mood and affect. Her behavior is normal.  Nursing note and vitals reviewed.   ED Course  Procedures (including critical care time)  DIAGNOSTIC STUDIES: Oxygen Saturation is 99% on RA, normal by my interpretation.    COORDINATION OF CARE: 2:44 PM - Discussed treatment plan with pt at bedside which includes crutches and ASO. RICE protocol discussed. Pt verbalized understanding and  agreed to plan.   MDM  Vital signs are well within normal limits. The x-ray of the left ankle is negative for fracture or dislocation. The examination is consistent with ankle sprain. The patient is treated with an ASO splint, ice pack, crutches, and prescription for 600 mg ibuprofen and Norco. The patient will follow with Dr. Romeo Apple if any changes, problems, or concerns.    Final diagnoses:  Ankle sprain, left, initial encounter    **I have reviewed nursing notes, vital signs, and all appropriate lab and imaging results for this patient.Ivery Quale, PA-C 10/14/15 1533  Donnetta Hutching, MD 10/14/15 [phone]

## 2015-10-17 ENCOUNTER — Ambulatory Visit (INDEPENDENT_AMBULATORY_CARE_PROVIDER_SITE_OTHER): Payer: BLUE CROSS/BLUE SHIELD | Admitting: Orthopaedic Surgery

## 2015-10-17 ENCOUNTER — Encounter: Payer: Self-pay | Admitting: Orthopaedic Surgery

## 2015-10-17 VITALS — BP 140/89 | HR 74 | Temp 99.0°F | Resp 16 | Ht 66.0 in | Wt 175.0 lb

## 2015-10-17 DIAGNOSIS — S96912A Strain of unspecified muscle and tendon at ankle and foot level, left foot, initial encounter: Secondary | ICD-10-CM | POA: Diagnosis not present

## 2015-10-17 MED ORDER — NAPROXEN 500 MG PO TABS: 500.0000 mg | ORAL_TABLET | Freq: Two times a day (BID) | ORAL | Status: DC

## 2015-10-17 NOTE — Patient Instructions (Addendum)
OUT OF WORK NOTE PICK MEDICINE UP AT PHARMACY

## 2015-10-17 NOTE — Progress Notes (Addendum)
CC: Left ankle pain  Subjective:    Patient ID: [name], female    DOB: 08/15/1976, 40 y.o.   MRN: [id number]  Ankle Injury  The incident occurred 3 to 5 days ago. The injury mechanism was a fall. The pain is present in the left ankle. The quality of the pain is described as aching and burning. The pain is at a severity of 5/10. The pain is moderate. The pain has been worsening since onset. Associated symptoms include an inability to bear weight and a loss of motion. Pertinent negatives include no loss of sensation, muscle weakness, numbness or tingling. She has tried elevation, acetaminophen and non-weight bearing for the symptoms. The treatment provided mild relief.   [place].  She has pain in the left ankle and left foot.  She was seen in the ER.  I have reviewed the ER notes and the X-rays and x-ray report.  She was given a brace and crutches but is not using the brace.   Review of Systems  Constitutional:       She does not have diabetes.  She does not have hypertension  She does not have COPD  She currently smokes and is not willing to quit.  HENT: Negative for congestion.   Respiratory: Negative for cough and shortness of breath.   Cardiovascular: Negative for chest pain.  Endocrine: Positive for cold intolerance.  Musculoskeletal: Positive for joint swelling, arthralgias and gait problem.  Allergic/Immunologic: Negative for environmental allergies.  Neurological: Negative for tingling and numbness.  All other systems reviewed and are negative.  Social History   Social History  . Marital Status: Married    Spouse Name: N/A  . Number of Children: N/A  . Years of Education: N/A   Occupational History  . Not on file.   Social History Main Topics  . Smoking status: Current Every Day Smoker  . Smokeless tobacco: Not on file  . Alcohol Use: Yes     Comment: "a glass of wine daily"  . Drug Use: Yes    Special: Marijuana     Comment:  last use "2 weeks ago"  . Sexual Activity: Not on file   Other Topics Concern  . Not on file   Social History Narrative  No past surgical history on file.   The patient has a family history ofhyperension  BP 140/89 mmHg  Pulse 74  Temp(Src) 99 F (37.2 C)  Resp 16  Ht  (1.676 m)  Wt 175 lb (79.379 kg)  BMI 28.26 kg/m2  LMP 09/13/2015   Objective:   Physical Exam  Constitutional: She is oriented to person, place, and time. She appears well-developed and well-nourished.  HENT:  Head: Normocephalic and atraumatic.  Eyes: Conjunctivae and EOM are normal. Pupils are equal, round, and reactive to light.  Neck: Normal range of motion. Neck supple.  Cardiovascular: Normal rate, regular rhythm, normal heart sounds and intact distal pulses.   Pulmonary/Chest: Effort normal and breath sounds normal.  Musculoskeletal: She exhibits tenderness (pain and swelling of the dorsum of the left foot and lateral ankle.  No redness.  NV intact.).       Feet:  Neurological: She is alert and oriented to person, place, and time. She has normal reflexes. She displays normal reflexes. No cranial nerve deficit. She exhibits normal muscle tone. Coordination normal.  Skin: Skin is warm and dry.  Psychiatric: She has a normal mood and affect. Her behavior is  normal. Judgment and thought content normal.    I have talked to her about her smoking and she is not willing to cut back at this time.  I have given instructions for Contrast Baths and have given her a sheet of instructions.      Assessment & Plan:  Strain of the left ankle and foot  She is to do the Contrast Baths.  She is to stay out of work.  She is to use her splint and crutches.  Return in one week.  Call if any problesms.

## 2015-10-24 ENCOUNTER — Encounter: Payer: Self-pay | Admitting: Orthopaedic Surgery

## 2015-10-24 ENCOUNTER — Ambulatory Visit (INDEPENDENT_AMBULATORY_CARE_PROVIDER_SITE_OTHER): Payer: BLUE CROSS/BLUE SHIELD | Admitting: Orthopaedic Surgery

## 2015-10-24 VITALS — BP 130/81 | HR 73 | Temp 99.3°F | Ht 66.0 in | Wt 178.2 lb

## 2015-10-24 DIAGNOSIS — S96912A Strain of unspecified muscle and tendon at ankle and foot level, left foot, initial encounter: Secondary | ICD-10-CM

## 2015-10-24 NOTE — Patient Instructions (Addendum)
Work note- return to work 10/29/15, if unable to work, return for note extension    You Can Quit Smoking If you are ready to quit smoking or are thinking about it, congratulations! You have chosen to help yourself be healthier and live longer! There are lots of different ways to quit smoking. Nicotine gum, nicotine patches, a nicotine inhaler, or nicotine nasal spray can help with physical craving. Hypnosis, support groups, and medicines help break the habit of smoking. TIPS TO GET OFF AND STAY OFF CIGARETTES  Learn to predict your moods. Do not let a bad situation be your excuse to have a cigarette. Some situations in your life might tempt you to have a cigarette.  Ask friends and co-workers not to smoke around you.  Make your home smoke-free.  Never have "just one" cigarette. It leads to wanting another and another. Remind yourself of your decision to quit.  On a card, make a list of your reasons for not smoking. Read it at least the same number of times a day as you have a cigarette. Tell yourself everyday, "I do not want to smoke. I choose not to smoke."  Ask someone at home or work to help you with your plan to quit smoking.  Have something planned after you eat or have a cup of coffee. Take a walk or get other exercise to perk you up. This will help to keep you from overeating.  Try a relaxation exercise to calm you down and decrease your stress. Remember, you may be tense and nervous the first two weeks after you quit. This will pass.  Find new activities to keep your hands busy. Play with a pen, coin, or rubber band. Doodle or draw things on paper.  Brush your teeth right after eating. This will help cut down the craving for the taste of tobacco after meals. You can try mouthwash too.  Try gum, breath mints, or diet candy to keep something in your mouth. IF YOU SMOKE AND WANT TO QUIT:  Do not stock up on cigarettes. Never buy a carton. Wait until one pack is finished before you  buy another.  Never carry cigarettes with you at work or at home.  Keep cigarettes as far away from you as possible. Leave them with someone else.  Never carry matches or a lighter with you.  Ask yourself, "Do I need this cigarette or is this just a reflex?"  Bet with someone that you can quit. Put cigarette money in a piggy bank every morning. If you smoke, you give up the money. If you do not smoke, by the end of the week, you keep the money.  Keep trying. It takes 21 days to change a habit!  Talk to your doctor about using medicines to help you quit. These include nicotine replacement gum, lozenges, or skin patches.   This information is not intended to replace advice given to you by your health care provider. Make sure you discuss any questions you have with your health care provider.   Document Released: 05/31/2009 Document Revised: 10/27/2011 Document Reviewed: 05/31/2009 Elsevier Interactive Patient Education [employer]

## 2015-10-24 NOTE — Progress Notes (Signed)
Patient NW:GNFAOZH:[name], female DOB:1975-12-07, 40 y.o. [id number]  Chief Complaint  Patient presents with  . Follow-up    Left ankle sprain "a little better"    HPI  [name] is a 40 y.o. female who has left ankle strain. She comes in today again without the ankle brace she has.  She tells me she uses it at home but I have not seen her in it yet.  She has less pain and swelling.  She wants to return to work Monday.  I will give her a note.  HPI  Body mass index is 28.78 kg/(m^2).   Review of Systems  Constitutional:       She does not have diabetes.  She does not have hypertension  She does not have COPD  She currently smokes and is not willing to quit.  Musculoskeletal: Positive for joint swelling, arthralgias and gait problem.  Neurological: Negative for numbness.    No past medical history on file.  No past surgical history on file.  No family history on file.  Social History Social History  Substance Use Topics  . Smoking status: Current Every Day Smoker  . Smokeless tobacco: None  . Alcohol Use: Yes     Comment: "a glass of wine daily"    No Known Allergies  Current Outpatient Prescriptions  Medication Sig Dispense Refill  . etonogestrel (IMPLANON) 68 MG IMPL implant Inject 1 each into the skin once.    Marland Kitchen. HYDROcodone-acetaminophen (NORCO/VICODIN) 5-325 MG tablet Take 1 tablet by mouth every 4 (four) hours as needed. 15 tablet 0  . naproxen (NAPROSYN) 500 MG tablet Take 1 tablet (500 mg total) by mouth 2 (two) times daily with a meal. 60 tablet 5   No current facility-administered medications for this visit.     Physical Exam  Blood pressure 130/81, pulse 73, temperature 99.3 F (37.4 C), height 5\' 6"  (1.676 m), weight 178 lb 3.2 oz (80.831 kg), last menstrual period 09/13/2015.  Constitutional: overall normal hygiene, normal nutrition, well developed, normal grooming, normal body habitus. Assistive device:none  Musculoskeletal: gait and  station Limp left, muscle tone and strength are normal, no tremors or atrophy is present.  .  Neurological: coordination overall normal.  Deep tendon reflex/nerve stretch intact.  Sensation normal.  Cranial nerves II-XII intact.   Skin:   normal overall no scars, lesions, ulcers or rashes. No psoriasis.  Psychiatric: Alert and oriented x 3.  Recent memory intact, remote memory unclear.  Normal mood and affect. Well groomed.  Good eye contact.  Cardiovascular: overall no swelling, no varicosities, no edema bilaterally, normal temperatures of the legs and arms, no clubbing, cyanosis and good capillary refill.  Lymphatic: palpation is normal.   Extremities:left ankle with some medial and lateral swelling still, more tender laterally.  No redness.  Right ankle normal. Inspection as above Strength and tone normal Range of motion slight decrease motion of the left ankle but improved from last time.  Right ankle full.  NV is intact.  The patient has been educated about the nature of the problem(s) and counseled on treatment options.  The patient appeared to understand what I have discussed and is in agreement with it.  PLAN Call if any problems.  Precautions discussed.  Continue current medications.   Return to clinic 2 wks

## 2015-11-07 ENCOUNTER — Ambulatory Visit (INDEPENDENT_AMBULATORY_CARE_PROVIDER_SITE_OTHER): Payer: BLUE CROSS/BLUE SHIELD | Admitting: Orthopaedic Surgery

## 2015-11-07 VITALS — BP 149/86 | HR 84 | Temp 98.1°F | Ht 66.0 in | Wt 178.0 lb

## 2015-11-07 DIAGNOSIS — S96912D Strain of unspecified muscle and tendon at ankle and foot level, left foot, subsequent encounter: Secondary | ICD-10-CM | POA: Diagnosis not present

## 2015-11-07 MED ORDER — HYDROCODONE-ACETAMINOPHEN 5-325 MG PO TABS: 1.0000 | ORAL_TABLET | ORAL | Status: DC | PRN

## 2015-11-07 NOTE — Progress Notes (Signed)
Patient ZO:XWRUEAV:[name], female DOB:1975-09-14, 40 y.o. [id number]  Chief Complaint  Patient presents with  . Follow-up    Left ankle    HPI  [name] is a 40 y.o. female who has a resolving left ankle strain.  She is still using her ankle brace.  She is working.  She has some swelling still.  She has no new trauma.  She has no redness.  She has no skin problems.  HPI  Body mass index is 28.74 kg/(m^2).  Review of Systems  Constitutional:       She does not have diabetes.  She does not have hypertension  She does not have COPD  She currently smokes and is not willing to quit.  HENT: Negative for congestion.   Respiratory: Negative for shortness of breath.   Cardiovascular: Negative for leg swelling.  Endocrine: Positive for cold intolerance.  Musculoskeletal: Positive for joint swelling, arthralgias and gait problem.  Neurological: Negative for numbness.    No past medical history on file.  No past surgical history on file.  No family history on file.  Social History Social History  Substance Use Topics  . Smoking status: Current Every Day Smoker  . Smokeless tobacco: Not on file  . Alcohol Use: Yes     Comment: "a glass of wine daily"    No Known Allergies  Current Outpatient Prescriptions  Medication Sig Dispense Refill  . etonogestrel (IMPLANON) 68 MG IMPL implant Inject 1 each into the skin once.    Marland Kitchen. HYDROcodone-acetaminophen (NORCO/VICODIN) 5-325 MG tablet Take 1 tablet by mouth every 4 (four) hours as needed. 15 tablet 0  . naproxen (NAPROSYN) 500 MG tablet Take 1 tablet (500 mg total) by mouth 2 (two) times daily with a meal. 60 tablet 5   No current facility-administered medications for this visit.     Physical Exam  Blood pressure 149/86, pulse 84, temperature 98.1 F (36.7 C), height 5\' 6"  (1.676 m), weight 178 lb (80.74 kg), last menstrual period 09/13/2015.  Constitutional: overall normal hygiene, normal nutrition, well developed,  normal grooming, normal body habitus. Assistive device:braces  Musculoskeletal: gait and station Limp left slightly, muscle tone and strength are normal, no tremors or atrophy is present.  .  Neurological: coordination overall normal.  Deep tendon reflex/nerve stretch intact.  Sensation normal.  Cranial nerves II-XII intact.   Skin:   normal overall no scars, lesions, ulcers or rashes. No psoriasis.  Psychiatric: Alert and oriented x 3.  Recent memory intact, remote memory unclear.  Normal mood and affect. Well groomed.  Good eye contact.  Cardiovascular: overall no swelling, no varicosities, no edema bilaterally, normal temperatures of the legs and arms, no clubbing, cyanosis and good capillary refill.  Lymphatic: palpation is normal.   Extremities:the left ankle has slight lateral swelling and tenderness over the anterior talofibular ligament.  No redness is present. Inspection per above Strength and tone normal Range of motion nearly full but tender more in plantar flexion.  The patient has been educated about the nature of the problem(s) and counseled on treatment options.  The patient appeared to understand what I have discussed and is in agreement with it.  Encounter Diagnosis  Name Primary?  . Left ankle strain, subsequent encounter Yes    PLAN Call if any problems.  Precautions discussed.  Continue current medications.   Return to clinic 3 weeks

## 2015-11-28 ENCOUNTER — Encounter: Payer: Self-pay | Admitting: Orthopaedic Surgery

## 2015-11-28 ENCOUNTER — Ambulatory Visit (INDEPENDENT_AMBULATORY_CARE_PROVIDER_SITE_OTHER): Payer: BLUE CROSS/BLUE SHIELD | Admitting: Orthopaedic Surgery

## 2015-11-28 VITALS — BP 113/87 | HR 79 | Temp 97.9°F | Resp 16 | Ht 66.0 in | Wt 178.0 lb

## 2015-11-28 DIAGNOSIS — M25572 Pain in left ankle and joints of left foot: Secondary | ICD-10-CM | POA: Diagnosis not present

## 2015-11-28 NOTE — Patient Instructions (Signed)
MRI ORDERED. We will contact your insurance company for pre-certification. After we receive that, we will schedule you an appointment for the MRI and contact you. If you have not heard from our office in one week, contact us.     

## 2015-11-28 NOTE — Progress Notes (Signed)
Patient HY:QMVHQIO:[name], female DOB:02-07-1976, 40 y.o. NGE:[id number]  Chief Complaint  Patient presents with  . Follow-up    LEFT ANKLE    HPI  [name] is a 40 y.o. female who has continued left ankle pain that is not getting better.  She has lateral pain and swelling.  She has no redness or new trauma.  She cannot wear high heels and had to get flats to go to a wedding last weekend.  She has weakness of her ankle.  She has been working. She has tried ice, elevation, exercises with little help.  I will get a MRI of the left ankle.  HPI  Body mass index is 28.74 kg/(m^2).  Review of Systems  Constitutional:       She does not have diabetes.  She does not have hypertension  She does not have COPD  She currently smokes and is not willing to quit.  HENT: Negative for congestion.   Respiratory: Negative for shortness of breath.   Cardiovascular: Negative for leg swelling.  Endocrine: Positive for cold intolerance.  Musculoskeletal: Positive for joint swelling, arthralgias and gait problem.  Neurological: Negative for numbness.    History reviewed. No pertinent past medical history.  History reviewed. No pertinent past surgical history.  History reviewed. No pertinent family history.  Social History Social History  Substance Use Topics  . Smoking status: Current Every Day Smoker  . Smokeless tobacco: None  . Alcohol Use: Yes     Comment: "a glass of wine daily"    No Known Allergies  Current Outpatient Prescriptions  Medication Sig Dispense Refill  . etonogestrel (IMPLANON) 68 MG IMPL implant Inject 1 each into the skin once.    Marland Kitchen. HYDROcodone-acetaminophen (NORCO/VICODIN) 5-325 MG tablet Take 1 tablet by mouth every 4 (four) hours as needed. 15 tablet 0  . naproxen (NAPROSYN) 500 MG tablet Take 1 tablet (500 mg total) by mouth 2 (two) times daily with a meal. 60 tablet 5   No current facility-administered medications for this visit.     Physical  Exam  Blood pressure 113/87, pulse 79, temperature 97.9 F (36.6 C), resp. rate 16, height 5\' 6"  (1.676 m), weight 178 lb (80.74 kg).  Constitutional: overall normal hygiene, normal nutrition, well developed, normal grooming, normal body habitus. Assistive device:none  Musculoskeletal: gait and station Limp left, muscle tone and strength are normal, no tremors or atrophy is present.  .  Neurological: coordination overall normal.  Deep tendon reflex/nerve stretch intact.  Sensation normal.  Cranial nerves II-XII intact.   Skin:   normal overall no scars, lesions, ulcers or rashes. No psoriasis.  Psychiatric: Alert and oriented x 3.  Recent memory intact, remote memory unclear.  Normal mood and affect. Well groomed.  Good eye contact.  Cardiovascular: overall no swelling, no varicosities, no edema bilaterally, normal temperatures of the legs and arms, no clubbing, cyanosis and good capillary refill.  Lymphatic: palpation is normal. Right Ankle Exam  Right ankle exam is normal.   Left Ankle Exam  Swelling: mild  Tenderness  The patient is experiencing tenderness in the ATF.   Range of Motion  Dorsiflexion: normal  Plantar flexion: normal  Inversion: normal  Eversion: normal   Muscle Strength  The patient has normal left ankle strength.  Tests  Anterior drawer: negative Varus tilt: negative  Other  Erythema: absent Scars: absent Sensation: normal Pulse: present      The patient has been educated about the nature of the problem(s) and counseled  on treatment options.  The patient appeared to understand what I have discussed and is in agreement with it.  Encounter Diagnosis  Name Primary?  . Left ankle pain Yes    PLAN Call if any problems.  Precautions discussed.  Continue current medications.   Return to clinic after MRI of the left ankle

## 2015-11-29 ENCOUNTER — Telehealth: Payer: Self-pay | Admitting: *Deleted

## 2015-11-29 NOTE — Telephone Encounter (Signed)
LMOM x 1 for pt to return call. 

## 2015-12-03 ENCOUNTER — Telehealth: Payer: Self-pay | Admitting: *Deleted

## 2015-12-03 NOTE — Telephone Encounter (Signed)
Patient was made aware of MRI and follow up appointment date and times.

## 2015-12-07 ENCOUNTER — Ambulatory Visit (HOSPITAL_COMMUNITY)
Admission: RE | Admit: 2015-12-07 | Discharge: 2015-12-07 | Disposition: A | Discharge status: Home / Self Care | Payer: BLUE CROSS/BLUE SHIELD | Source: Ambulatory Visit | Attending: Orthopaedic Surgery | Admitting: Orthopaedic Surgery

## 2015-12-07 DIAGNOSIS — S82402A Unspecified fracture of shaft of left fibula, initial encounter for closed fracture: Secondary | ICD-10-CM | POA: Diagnosis not present

## 2015-12-07 DIAGNOSIS — M25572 Pain in left ankle and joints of left foot: Secondary | ICD-10-CM | POA: Insufficient documentation

## 2015-12-07 DIAGNOSIS — X58XXXA Exposure to other specified factors, initial encounter: Secondary | ICD-10-CM | POA: Diagnosis not present

## 2015-12-12 ENCOUNTER — Ambulatory Visit (INDEPENDENT_AMBULATORY_CARE_PROVIDER_SITE_OTHER): Payer: BLUE CROSS/BLUE SHIELD | Admitting: Orthopaedic Surgery

## 2015-12-12 ENCOUNTER — Encounter: Payer: Self-pay | Admitting: Orthopaedic Surgery

## 2015-12-12 VITALS — BP 144/78 | HR 77 | Temp 98.1°F | Resp 16 | Ht 66.0 in | Wt 175.0 lb

## 2015-12-12 DIAGNOSIS — S8262XS Displaced fracture of lateral malleolus of left fibula, sequela: Secondary | ICD-10-CM | POA: Diagnosis not present

## 2015-12-12 DIAGNOSIS — M25572 Pain in left ankle and joints of left foot: Secondary | ICD-10-CM

## 2015-12-12 DIAGNOSIS — M949 Disorder of cartilage, unspecified: Principal | ICD-10-CM

## 2015-12-12 DIAGNOSIS — M899 Disorder of bone, unspecified: Secondary | ICD-10-CM

## 2015-12-12 MED ORDER — IBUPROFEN 600 MG PO TABS: 600.0000 mg | ORAL_TABLET | Freq: Four times a day (QID) | ORAL | Status: DC | PRN

## 2015-12-12 NOTE — Progress Notes (Signed)
Patient [name], female DOB:May 02, 1976, 40 y.o. [id number]  Chief Complaint  Patient presents with  . Follow-up    left ankle mri results "same"    HPI  [name] is a 40 y.o. female who has had ankle pain on the left for a period of time with no relief.  She continues to hurt with activity.  She is tired of hurting.  I got a MRI of the ankle.  The results are:  IMPRESSION: Nondisplaced fracture of the tip of the distal fibula as described above with associated marrow edema consistent with subacute injury. Decreased T1 and T2 signal in the distal most aspect of the fracture fragment may be due to osteonecrosis.  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 No bone fragment instability is identified.  Negative for tendon or ligament tear.  I have gone over the results with her in detail.  I feel she needs to see an orthopaedist who does ankle arthroscopy as that is a possible procedure.  I will have her see [name] or one of his associates.  She is agreeable. HPI  Body mass index is 28.26 kg/(m^2).   Review of Systems  Constitutional:       She does not have diabetes.  She does not have hypertension  She does not have COPD  She currently smokes and is not willing to quit.  HENT: Negative for congestion.   Respiratory: Negative for shortness of breath.   Cardiovascular: Negative for leg swelling.  Endocrine: Positive for cold intolerance.  Musculoskeletal: Positive for joint swelling, arthralgias and gait problem.  Neurological: Negative for numbness.    History reviewed. No pertinent past medical history.  History reviewed. No pertinent past surgical history.  History reviewed. No pertinent family history.  Social History Social History  Substance Use Topics  . Smoking status: Current Every Day Smoker  . Smokeless tobacco: None  . Alcohol Use: Yes     Comment: "a glass of wine daily"     No Known Allergies  Current Outpatient Prescriptions  Medication Sig Dispense Refill  . etonogestrel (IMPLANON) 68 MG IMPL implant Inject 1 each into the skin once.    Marland Kitchen HYDROcodone-acetaminophen (NORCO/VICODIN) 5-325 MG tablet Take 1 tablet by mouth every 4 (four) hours as needed. 15 tablet 0  . ibuprofen (ADVIL,MOTRIN) 600 MG tablet Take 1 tablet (600 mg total) by mouth every 6 (six) hours as needed. 100 tablet 5   No current facility-administered medications for this visit.     Physical Exam  Blood pressure 144/78, pulse 77, temperature 98.1 F (36.7 C), resp. rate 16, height  (1.676 m), weight 175 lb (79.379 kg).  Constitutional: overall normal hygiene, normal nutrition, well developed, normal grooming, normal body habitus. Assistive device:none  Musculoskeletal: gait and station Limp left, muscle tone and strength are normal, no tremors or atrophy is present.  .  Neurological: coordination overall normal.  Deep tendon reflex/nerve stretch intact.  Sensation normal.  Cranial nerves II-XII intact.   Skin:   normal overall no scars, lesions, ulcers or rashes. No psoriasis.  Psychiatric: Alert and oriented x 3.  Recent memory intact, remote memory unclear.  Normal mood and affect. Well groomed.  Good eye contact.  Cardiovascular: overall no swelling, no varicosities, no edema bilaterally, normal temperatures of the legs and arms, no clubbing, cyanosis and good capillary refill.  Lymphatic: palpation is normal. Right Ankle Exam  Right ankle exam is normal.  Left Ankle Exam  Swelling: mild  Tenderness  The patient is experiencing tenderness in the medial malleolus and ATF.   Range of Motion  The patient has normal left ankle ROM.   Muscle Strength  The patient has normal left ankle strength.  Tests  Anterior drawer: negative  Other  Erythema: absent Scars: absent Sensation: normal Pulse: present  Comments:  She has more tenderness medial ankle joint  and anteriorly.      The patient has been educated about the nature of the problem(s) and counseled on treatment options.  The patient appeared to understand what I have discussed and is in agreement with it.  Encounter Diagnoses  Name Primary?  . Left ankle pain   . Osteochondral lesion Yes  . Fracture of left ankle, lateral malleolus, sequela     PLAN Call if any problems.  Precautions discussed.  Continue current medications.   Return to clinic to see Dr. Lajoyce Cornersuda or one of his associates for further evaluatoin of ankle problem.

## 2016-01-02 ENCOUNTER — Ambulatory Visit (INDEPENDENT_AMBULATORY_CARE_PROVIDER_SITE_OTHER): Payer: BLUE CROSS/BLUE SHIELD | Admitting: Adult Health

## 2016-01-02 ENCOUNTER — Encounter: Payer: Self-pay | Admitting: Adult Health

## 2016-01-02 VITALS — BP 140/90 | HR 86 | Ht 66.0 in | Wt 178.5 lb

## 2016-01-02 DIAGNOSIS — Z3046 Encounter for surveillance of implantable subdermal contraceptive: Secondary | ICD-10-CM | POA: Diagnosis not present

## 2016-01-02 HISTORY — DX: Encounter for surveillance of implantable subdermal contraceptive: Z30.46

## 2016-01-02 NOTE — Progress Notes (Signed)
Subjective:     Patient ID: Berline ChoughLamanya Rincon, female   DOB: 03-08-1976, 40 y.o.   MRN: [id number]  HPI Sherryle LisLamanya is a 40 year old black female, married in for nexplanon removal, she want another baby.  Review of Systems For nexplanon removal Reviewed past medical,surgical, social and family history. Reviewed medications and allergies.     Objective:   Physical Exam BP 140/90 mmHg  Pulse 86  Ht 5\' 6"  (1.676 m)  Wt 178 lb 8 oz (80.967 kg)  BMI 28.82 kg/m2 consent signed, time out called, right arm cleansed with betadine, and injected with 1.5 cc 2% lidocaine and waited til numb.Under sterile technique a #11 blade was used to make small vertical incision, and a curved forceps was used to easily remove rod. Steri strips applied. Pressure dressing applied.    Assessment:    Nexplanon removal     Plan:       Use condoms , keep clean and dry x 24 hours, no heavy lifting, keep steri strips on x 72 hours, Keep pressure dressing on x 24 hours. Follow up prn problems. Take prenatal vitamins Return in 1 month for pap and physical

## 2016-01-02 NOTE — Patient Instructions (Signed)
Use condoms, keep clean and dry x 24 hours, no heavy lifting, keep steri strips on x 72 hours, Keep pressure dressing on x 24 hours. Follow up prn problems. Take prenatal vitamins Return in 1 month for pap and physical

## 2016-02-01 ENCOUNTER — Other Ambulatory Visit (HOSPITAL_COMMUNITY)
Admission: RE | Admit: 2016-02-01 | Discharge: 2016-02-01 | Disposition: A | Discharge status: Home / Self Care | Payer: BLUE CROSS/BLUE SHIELD | Source: Ambulatory Visit | Attending: Adult Health | Admitting: Adult Health

## 2016-02-01 ENCOUNTER — Encounter: Payer: Self-pay | Admitting: Adult Health

## 2016-02-01 ENCOUNTER — Ambulatory Visit (INDEPENDENT_AMBULATORY_CARE_PROVIDER_SITE_OTHER): Payer: BLUE CROSS/BLUE SHIELD | Admitting: Adult Health

## 2016-02-01 VITALS — BP 110/80 | HR 70 | Ht 66.5 in | Wt 180.0 lb

## 2016-02-01 DIAGNOSIS — Z3202 Encounter for pregnancy test, result negative: Secondary | ICD-10-CM | POA: Diagnosis not present

## 2016-02-01 DIAGNOSIS — Z01411 Encounter for gynecological examination (general) (routine) with abnormal findings: Secondary | ICD-10-CM | POA: Diagnosis present

## 2016-02-01 DIAGNOSIS — Z1151 Encounter for screening for human papillomavirus (HPV): Secondary | ICD-10-CM | POA: Insufficient documentation

## 2016-02-01 DIAGNOSIS — Z01419 Encounter for gynecological examination (general) (routine) without abnormal findings: Secondary | ICD-10-CM | POA: Diagnosis not present

## 2016-02-01 DIAGNOSIS — Z319 Encounter for procreative management, unspecified: Secondary | ICD-10-CM

## 2016-02-01 HISTORY — DX: Encounter for procreative management, unspecified: Z31.9

## 2016-02-01 LAB — POCT URINE PREGNANCY: PREG TEST UR: NEGATIVE

## 2016-02-01 MED ORDER — FLINTSTONES COMPLETE 60 MG PO CHEW: CHEWABLE_TABLET | ORAL | Status: DC

## 2016-02-01 NOTE — Patient Instructions (Signed)
Physical in 1 year,pap in 3 if normal Take flintstones  Preparing for Pregnancy Before trying to become pregnant, make an appointment with your health care provider (preconception care). The goal is to help you have a healthy, safe pregnancy. At your first appointment, your health care provider will:   Do a complete physical exam, including a Pap test.  Take a complete medical history.  Give you advice and help you resolve any problems. PRECONCEPTION CHECKLIST Here is a list of the basics to cover with your health care provider at your preconception visit:  Medical history.  Tell your health care provider about any diseases you have had. Many diseases can affect your pregnancy.  Include your partner's medical history and family history.  Make sure you have been tested for sexually transmitted infections (STIs). These can affect your pregnancy. In some cases, they can be passed to your baby. Tell your health care provider about any history of STIs.  Make sure your health care provider knows about any previous problems you have had with conception or pregnancy.  Tell your health care provider about any medicine you take. This includes herbal supplements and over-the-counter medicines.  Make sure all your immunizations are up to date. You may need to make additional appointments.  Ask your health care provider if you need any vaccinations or if there are any you should avoid.  Diet.  It is especially important to eat a healthy, balanced diet with the right nutrients when you are pregnant.  Ask your health care provider to help you get to a healthy weight before pregnancy.  If you are overweight, you are at higher risk for certain complications. These include high blood pressure, diabetes, and preterm birth.  If you are underweight, you are more likely to have a low-birth-weight baby.  Lifestyle.  Tell your health care provider about lifestyle factors such as alcohol use, drug  use, or smoking.  Describe any harmful substances you may be exposed to at work or home. These can include chemicals, pesticides, and radiation.  Mental health.  Let your health care provider know if you have been feeling depressed or anxious.  Let your health care provider know if you have a history of substance abuse.  Let your health care provider know if you do not feel safe at home. HOME INSTRUCTIONS TO PREPARE FOR PREGNANCY Follow your health care provider's advice and instructions.   Keep an accurate record of your menstrual periods. This makes it easier for your health care provider to determine your baby's due date.  Begin taking prenatal vitamins and folic acid supplements daily. Take them as directed by your health care provider.  Eat a balanced diet. Get help from a nutrition counselor if you have questions or need help.  Get regular exercise. Try to be active for at least 30 minutes a day most days of the week.  Quit smoking, if you smoke.  Do not drink alcohol.  Do not take illegal drugs.  Get medical problems, such as diabetes or high blood pressure, under control.  If you have diabetes, make sure you do the following:  Have good blood sugar control. If you have type 1 diabetes, use multiple daily doses of insulin. Do not use split-dose or premixed insulin.  Have an eye exam by a qualified eye care professional trained in caring for people with diabetes.  Get evaluated by your health care provider for cardiovascular disease.  Get to a healthy weight. If you are overweight or  obese, reduce your weight with the help of a qualified health professional such as a Firefighter. Ask your health care provider what the right weight range is for you. HOW DO I KNOW I AM PREGNANT? You may be pregnant if you have been sexually active and you miss your period. Symptoms of early pregnancy include:   Mild cramping.  Very light vaginal bleeding (spotting).  Feeling  unusually tired.  Morning sickness. If you have any of these symptoms, take a home pregnancy test. These tests look for a hormone called human chorionic gonadotropin (hCG) in your urine. Your body begins to make this hormone during early pregnancy. These tests are very accurate. Wait until at least the first day you miss your period to take one. If you get a positive result, call your health care provider to make appointments for prenatal care. WHAT SHOULD I DO IF I BECOME PREGNANT?  Make an appointment with your health care provider by week 12 of your pregnancy at the latest.  Do not smoke. Smoking can be harmful to your baby.  Do not drink alcoholic beverages. Alcohol is related to a number of birth defects.  Avoid toxic odors and chemicals.  You may continue to have sexual intercourse if it does not cause pain or other problems, such as vaginal bleeding.   This information is not intended to replace advice given to you by your health care provider. Make sure you discuss any questions you have with your health care provider.   Document Released: 07/17/2008 Document Revised: 08/25/2014 Document Reviewed: 07/11/2013 Elsevier Interactive Patient Education [employer].

## 2016-02-01 NOTE — Progress Notes (Signed)
Patient ID: [name], female   DOB: May 05, 1976, 40 y.o.   MRN: [id number] History of Present Illness: [name] is a 40 year old black female in for a well woman gyn exam and pap, she wants to get pregnant.   Current Medications, Allergies, Past Medical History, Past Surgical History, Family History and Social History were reviewed in [facility] electronic medical record.     Review of Systems: Patient denies any headaches, hearing loss, fatigue, blurred vision, shortness of breath, chest pain, abdominal pain, problems with bowel movements, urination, or intercourse. No joint pain or mood swings.    Physical Exam:BP 110/80 mmHg  Pulse 70  Ht 5' 6.5" (1.689 m)  Wt 180 lb (81.647 kg)  BMI 28.62 kg/m2  LMP 12/31/2015 UPT negative General:  Well developed, well nourished, no acute distress Skin:  Warm and dry Neck:  Midline trachea, normal thyroid, good ROM, no lymphadenopathy Lungs; Clear to auscultation bilaterally Breast:  No dominant palpable mass, retraction, or nipple discharge Cardiovascular: Regular rate and rhythm Abdomen:  Soft, non tender, no hepatosplenomegaly Pelvic:  External genitalia is normal in appearance, no lesions.  The vagina is normal in appearance. Urethra has no lesions or masses. The cervix is bulbous.Pap with HPV performed and GC/CHL performed.  Uterus is felt to be normal size, shape, and contour.  No adnexal masses or tenderness noted.Bladder is non tender, no masses felt. Extremities/musculoskeletal:  No swelling or varicosities noted, no clubbing or cyanosis Psych:  No mood changes, alert and cooperative,seems happy Discussed timing of sex, and need to take vitamins with folic acid.  Impression:  Well woman gyn exam and pap Desires pregnancy    Plan: GC/CHL sent Physical in 1 year, pap in 3 if normal Take 2 flinstones daily

## 2016-02-01 NOTE — Addendum Note (Signed)
Addended by: Criss AlvinePULLIAM, Dayona Shaheen G on: 02/01/2016 11:43 AM   Modules accepted: Orders

## 2016-02-04 LAB — CYTOLOGY - PAP

## 2016-02-04 LAB — GC/CHLAMYDIA PROBE AMP
CHLAMYDIA, DNA PROBE: NEGATIVE
Neisseria gonorrhoeae by PCR: NEGATIVE

## 2016-02-05 ENCOUNTER — Telehealth: Payer: Self-pay | Admitting: Adult Health

## 2016-02-05 ENCOUNTER — Encounter: Payer: Self-pay | Admitting: Adult Health

## 2016-02-05 DIAGNOSIS — R87619 Unspecified abnormal cytological findings in specimens from cervix uteri: Secondary | ICD-10-CM

## 2016-02-05 DIAGNOSIS — R87612 Low grade squamous intraepithelial lesion on cytologic smear of cervix (LGSIL): Secondary | ICD-10-CM

## 2016-02-05 HISTORY — DX: Unspecified abnormal cytological findings in specimens from cervix uteri: R87.619

## 2016-02-05 NOTE — Telephone Encounter (Signed)
Pt aware pap abnormal, LSIL with +HPV, will get colpo with Dr Despina HiddenEure, and she is aware GC/CHL negative

## 2016-02-05 NOTE — Telephone Encounter (Signed)
No voice mail.

## 2016-02-27 ENCOUNTER — Other Ambulatory Visit: Payer: Self-pay | Admitting: Obstetrics and Gynecology

## 2016-02-27 ENCOUNTER — Ambulatory Visit (INDEPENDENT_AMBULATORY_CARE_PROVIDER_SITE_OTHER): Payer: BLUE CROSS/BLUE SHIELD | Admitting: Obstetrics and Gynecology

## 2016-02-27 ENCOUNTER — Encounter: Payer: Self-pay | Admitting: Obstetrics and Gynecology

## 2016-02-27 VITALS — BP 120/76 | HR 72 | Ht 66.0 in | Wt 176.0 lb

## 2016-02-27 DIAGNOSIS — Z3200 Encounter for pregnancy test, result unknown: Secondary | ICD-10-CM

## 2016-02-27 DIAGNOSIS — Z3202 Encounter for pregnancy test, result negative: Secondary | ICD-10-CM | POA: Diagnosis not present

## 2016-02-27 DIAGNOSIS — N870 Mild cervical dysplasia: Secondary | ICD-10-CM | POA: Diagnosis not present

## 2016-02-27 DIAGNOSIS — R87612 Low grade squamous intraepithelial lesion on cytologic smear of cervix (LGSIL): Secondary | ICD-10-CM

## 2016-02-27 LAB — POCT URINE PREGNANCY: Preg Test, Ur: NEGATIVE

## 2016-02-27 NOTE — Progress Notes (Addendum)
Patient ID: [name], female   DOB: May 16, 1976, 40 y.o.   MRN: [id number]   [name] 40 y.o. 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  Pt denies unusual vaginal discharge.    Discussed role for HPV in cervical dysplasia, need for surveillance.  Patient given informed consent, signed copy in the chart, time out was performed.  Placed in lithotomy position. Cervix viewed with speculum and colposcope after application of acetic acid.     Colposcopy adequate? Yes Small amount of vaginal discharge present in the vaginal vault, wet prep collected. Small areas of white epithelium on anterior lip of cervix. End of cervix well visualized, appears normal; biopsies obtained at anterior lip of the cervix at 10 and 12 o-clock.   ECC specimen obtained. ..physical Physical Exam  Genitourinary:     Physical Exam  Genitourinary:      Bleeding controlled with AgNO3.   All specimens were labelled and sent to pathology.  Colposcopy IMPRESSION: CIN-1  Patient was given post procedure instructions. Will follow up pathology and manage accordingly.  Routine preventative health maintenance measures emphasized.  Assessment:  Wet prep collected, negative for abnormalities   Plan:  Yearly pap smear for the next 2 years.   F/u in 1 year for pap or PRN.     By signing my name below, I, Doreatha MartinEva [name], attest that this documentation has been prepared under the direction and in the presence of Tilda BurrowJohn V Kambrea Carrasco, MD. Electronically Signed: Doreatha MartinEva [name], ED Scribe. 02/27/2016. 11:08 AM.  I personally performed the services described in this documentation, which was SCRIBED in my presence. The recorded information has been reviewed and considered accurate. It has been edited as necessary during review. Tilda BurrowFERGUSON,Emonni Depasquale V, MD '

## 2016-09-24 ENCOUNTER — Encounter (HOSPITAL_COMMUNITY): Payer: Self-pay | Admitting: *Deleted

## 2016-09-24 ENCOUNTER — Emergency Department (HOSPITAL_COMMUNITY): Payer: Self-pay

## 2016-09-24 ENCOUNTER — Emergency Department (HOSPITAL_COMMUNITY)
Admission: EM | Admit: 2016-09-24 | Discharge: 2016-09-24 | Disposition: A | Discharge status: Home / Self Care | Payer: Self-pay | Attending: Emergency Medicine | Admitting: Emergency Medicine

## 2016-09-24 DIAGNOSIS — Z79899 Other long term (current) drug therapy: Secondary | ICD-10-CM | POA: Insufficient documentation

## 2016-09-24 DIAGNOSIS — R519 Headache, unspecified: Secondary | ICD-10-CM

## 2016-09-24 DIAGNOSIS — R51 Headache: Secondary | ICD-10-CM

## 2016-09-24 DIAGNOSIS — I10 Essential (primary) hypertension: Secondary | ICD-10-CM | POA: Insufficient documentation

## 2016-09-24 DIAGNOSIS — F1290 Cannabis use, unspecified, uncomplicated: Secondary | ICD-10-CM | POA: Insufficient documentation

## 2016-09-24 DIAGNOSIS — F17210 Nicotine dependence, cigarettes, uncomplicated: Secondary | ICD-10-CM | POA: Insufficient documentation

## 2016-09-24 LAB — URINALYSIS, ROUTINE W REFLEX MICROSCOPIC
BILIRUBIN URINE: NEGATIVE
GLUCOSE, UA: NEGATIVE mg/dL
HGB URINE DIPSTICK: NEGATIVE
KETONES UR: 80 mg/dL — AB
NITRITE: NEGATIVE
PROTEIN: 30 mg/dL — AB
Specific Gravity, Urine: 1.019 (ref 1.005–1.030)
pH: 6 (ref 5.0–8.0)

## 2016-09-24 LAB — BASIC METABOLIC PANEL
Anion gap: 11 (ref 5–15)
BUN: 9 mg/dL (ref 6–20)
CHLORIDE: 104 mmol/L (ref 101–111)
CO2: 23 mmol/L (ref 22–32)
CREATININE: 0.83 mg/dL (ref 0.44–1.00)
Calcium: 9.7 mg/dL (ref 8.9–10.3)
GFR calc non Af Amer: >60 mL/min (ref >60)
Glucose, Bld: 125 mg/dL — ABNORMAL HIGH (ref 65–99)
POTASSIUM: 3.2 mmol/L — AB (ref 3.5–5.1)
Sodium: 138 mmol/L (ref 135–145)

## 2016-09-24 LAB — CBC
HEMATOCRIT: 45.9 % (ref 36.0–46.0)
HEMOGLOBIN: 15.5 g/dL — AB (ref 12.0–15.0)
MCH: 34.4 pg — AB (ref 26.0–34.0)
MCHC: 33.8 g/dL (ref 30.0–36.0)
MCV: 101.8 fL — ABNORMAL HIGH (ref 78.0–100.0)
Platelets: 261 10*3/uL (ref 150–400)
RBC: 4.51 MIL/uL (ref 3.87–5.11)
RDW: 11.6 % (ref 11.5–15.5)
WBC: 8.1 10*3/uL (ref 4.0–10.5)

## 2016-09-24 LAB — PREGNANCY, URINE: PREG TEST UR: NEGATIVE

## 2016-09-24 MED ORDER — SODIUM CHLORIDE 0.9 % IV SOLN
1000.0000 mL | Freq: Once | INTRAVENOUS | Status: AC
  Administered 2016-09-24: 1000 mL via INTRAVENOUS

## 2016-09-24 MED ORDER — METOCLOPRAMIDE HCL 5 MG/ML IJ SOLN
10.0000 mg | Freq: Once | INTRAMUSCULAR | Status: AC
  Administered 2016-09-24: 10 mg via INTRAVENOUS
  Filled 2016-09-24: qty 2

## 2016-09-24 MED ORDER — KETOROLAC TROMETHAMINE 30 MG/ML IJ SOLN
30.0000 mg | Freq: Once | INTRAMUSCULAR | Status: AC
  Administered 2016-09-24: 30 mg via INTRAVENOUS
  Filled 2016-09-24: qty 1

## 2016-09-24 MED ORDER — SODIUM CHLORIDE 0.9 % IV SOLN
1000.0000 mL | INTRAVENOUS | Status: DC
  Administered 2016-09-24: 1000 mL via INTRAVENOUS

## 2016-09-24 MED ORDER — PROMETHAZINE HCL 25 MG PO TABS: 25.0000 mg | ORAL_TABLET | Freq: Four times a day (QID) | ORAL | 0 refills | Status: DC | PRN

## 2016-09-24 MED ORDER — KETOROLAC TROMETHAMINE 10 MG PO TABS: 10.0000 mg | ORAL_TABLET | Freq: Four times a day (QID) | ORAL | 0 refills | Status: DC | PRN

## 2016-09-24 MED ORDER — DIPHENHYDRAMINE HCL 50 MG/ML IJ SOLN
25.0000 mg | Freq: Once | INTRAMUSCULAR | Status: AC
  Administered 2016-09-24: 25 mg via INTRAVENOUS
  Filled 2016-09-24: qty 1

## 2016-09-24 NOTE — ED Triage Notes (Signed)
Pt c/o headache that has been intermittent for 2 weeks. Pt had this episode for 3 days. Pt states everything makes he head worse. Pt BP 178/102 in triage.

## 2016-09-24 NOTE — ED Provider Notes (Signed)
[facility] Provider Note   CSN: [id number] Arrival date & time: 09/24/16  1328     History   Chief Complaint Chief Complaint  Patient presents with  . Headache    HPI [name] is a 41 y.o. female.  Pt presents to the ED with a headache that has been going on for 3 days.  She has had nausea.  Her bp is high in triage, but she's never had a problem with elevated bp.  Pt does not usually get headaches.      Past Medical History:  Diagnosis Date  . Abnormal Pap smear of cervix 02/05/2016  . Encounter for Nexplanon removal 01/02/2016  . GERD (gastroesophageal reflux disease)   . Patient desires pregnancy 02/01/2016    Patient Active Problem List   Diagnosis Date Noted  . Abnormal Pap smear of cervix 02/05/2016  . Patient desires pregnancy 02/01/2016  . Encounter for Nexplanon removal 01/02/2016  . ACUTE BRONCHITIS 09/05/2009  . NICOTINE ADDICTION 02/06/2009  . ALLERGIC RHINITIS CAUSE UNSPECIFIED 11/12/2007  . HIP PAIN, RIGHT 11/12/2007  . BACK PAIN, CHRONIC, INTERMITTENT 11/12/2007    History reviewed. No pertinent surgical history.  OB History    Gravida Para Term Preterm AB Living   4 3 3   1 3    SAB TAB Ectopic Multiple Live Births   1       3       Home Medications    Prior to Admission medications   Medication Sig Start Date End Date Taking? Authorizing Provider  ibuprofen (ADVIL,MOTRIN) 200 MG tablet Take 400 mg by mouth every 6 (six) hours as needed.   Yes Historical Provider, MD  Omeprazole (PRILOSEC PO) Take 20 mg by mouth daily.    Yes Historical Provider, MD  ketorolac (TORADOL) 10 MG tablet Take 1 tablet (10 mg total) by mouth every 6 (six) hours as needed. 09/24/16   Jacalyn Lefevre, MD  promethazine (PHENERGAN) 25 MG tablet Take 1 tablet (25 mg total) by mouth every 6 (six) hours as needed for nausea or vomiting. 09/24/16   Jacalyn Lefevre, MD    Family History Family History  Problem Relation Age of Onset  . Cancer Maternal Grandmother    . Diabetes Maternal Grandmother   . Hypertension Maternal Grandmother   . Alcoholism Maternal Grandfather   . Cirrhosis Maternal Grandfather   . Diabetes Mother   . Hypertension Mother   . Cleft lip Son   . Cleft palate Son     Social History Social History  Substance Use Topics  . Smoking status: Current Every Day Smoker    Packs/day: 0.50    Years: 22.00    Types: Cigarettes  . Smokeless tobacco: Never Used  . Alcohol use 0.0 oz/week     Comment: "a glass of wine daily"     Allergies   Patient has no known allergies.   Review of Systems Review of Systems  Neurological: Positive for headaches.  All other systems reviewed and are negative.    Physical Exam Updated Vital Signs BP 189/90 (BP Location: Left Arm)   Pulse 105   Temp 97.5 F (36.4 C) (Oral)   Resp 18   Ht 5\' 6"  (1.676 m)   Wt 180 lb (81.6 kg)   LMP 09/09/2016   SpO2 99%   BMI 29.05 kg/m   Physical Exam  Constitutional: She is oriented to person, place, and time. She appears well-developed. She appears distressed.  HENT:  Head: Normocephalic and atraumatic.  Right Ear: External ear normal.  Left Ear: External ear normal.  Nose: Nose normal.  Mouth/Throat: Oropharynx is clear and moist.  Eyes: Conjunctivae and EOM are normal. Pupils are equal, round, and reactive to light.  Neck: Normal range of motion. Neck supple.  Cardiovascular: Normal rate, regular rhythm, normal heart sounds and intact distal pulses.   Pulmonary/Chest: Effort normal and breath sounds normal.  Abdominal: Soft. Bowel sounds are normal.  Musculoskeletal: Normal range of motion.  Neurological: She is alert and oriented to person, place, and time.  Skin: Skin is warm.  Psychiatric: She has a normal mood and affect. Her behavior is normal. Judgment and thought content normal.  Nursing note and vitals reviewed.    ED Treatments / Results  Labs (all labs ordered are listed, but only abnormal results are displayed) Labs  Reviewed  CBC - Abnormal; Notable for the following:       Result Value   Hemoglobin 15.5 (*)    MCV 101.8 (*)    MCH 34.4 (*)    All other components within normal limits  BASIC METABOLIC PANEL - Abnormal; Notable for the following:    Potassium 3.2 (*)    Glucose, Bld 125 (*)    All other components within normal limits  URINALYSIS, ROUTINE W REFLEX MICROSCOPIC - Abnormal; Notable for the following:    APPearance HAZY (*)    Ketones, ur 80 (*)    Protein, ur 30 (*)    Leukocytes, UA SMALL (*)    Bacteria, UA RARE (*)    All other components within normal limits  PREGNANCY, URINE    EKG  EKG Interpretation None       Radiology Ct Head Wo Contrast  Result Date: 09/24/2016 CLINICAL DATA:  Diffuse headache for 2-3 days. EXAM: CT HEAD WITHOUT CONTRAST TECHNIQUE: Contiguous axial images were obtained from the base of the skull through the vertex without intravenous contrast. COMPARISON:  None. FINDINGS: Brain: No acute infarct, hemorrhage, or mass lesion is present. The ventricles are of normal size. No significant extraaxial fluid collection is present. Vascular: No hyperdense vessel or unexpected calcification. Skull: Normal. Negative for fracture or focal lesion. Sinuses/Orbits: The paranasal sinuses and mastoid air cells are clear. The globes and orbits are intact. IMPRESSION: Negative CT of the head. Electronically Signed   By: Marin Roberts M.D.   On: 09/24/2016 15:15    Procedures Procedures (including critical care time)  Medications Ordered in ED Medications  0.9 %  sodium chloride infusion (1,000 mLs Intravenous New Bag/Given 09/24/16 1432)    Followed by  0.9 %  sodium chloride infusion (1,000 mLs Intravenous New Bag/Given 09/24/16 1553)  metoCLOPramide (REGLAN) injection 10 mg (10 mg Intravenous Given 09/24/16 1431)  diphenhydrAMINE (BENADRYL) injection 25 mg (25 mg Intravenous Given 09/24/16 1431)  ketorolac (TORADOL) 30 MG/ML injection 30 mg (30 mg Intravenous  Given 09/24/16 1553)     Initial Impression / Assessment and Plan / ED Course  I have reviewed the triage vital signs and the nursing notes.  Pertinent labs & imaging results that were available during my care of the patient were reviewed by me and considered in my medical decision making (see chart for details).    Pt is feeling much better.  Her bp is down, but it is still elevated.  I told her to f/u with her pcp for a recheck of her bp when she is well.  I spoke to her about eating a healthy diet and exercise.  She knows to return if worse.  Final Clinical Impressions(s) / ED Diagnoses   Final diagnoses:  Acute nonintractable headache, unspecified headache type  Essential hypertension    New Prescriptions New Prescriptions   KETOROLAC (TORADOL) 10 MG TABLET    Take 1 tablet (10 mg total) by mouth every 6 (six) hours as needed.   PROMETHAZINE (PHENERGAN) 25 MG TABLET    Take 1 tablet (25 mg total) by mouth every 6 (six) hours as needed for nausea or vomiting.     Jacalyn LefevreJulie Brier Firebaugh, MD 09/24/16 1556

## 2016-09-27 ENCOUNTER — Encounter (HOSPITAL_COMMUNITY): Payer: Self-pay | Admitting: Emergency Medicine

## 2016-09-27 ENCOUNTER — Emergency Department (HOSPITAL_COMMUNITY)
Admission: EM | Admit: 2016-09-27 | Discharge: 2016-09-27 | Disposition: A | Discharge status: Home / Self Care | Payer: Self-pay | Attending: Dermatology | Admitting: Dermatology

## 2016-09-27 ENCOUNTER — Emergency Department (HOSPITAL_COMMUNITY)
Admission: EM | Admit: 2016-09-27 | Discharge: 2016-09-28 | Disposition: A | Discharge status: Home / Self Care | Payer: Self-pay | Attending: Emergency Medicine | Admitting: Emergency Medicine

## 2016-09-27 DIAGNOSIS — Z791 Long term (current) use of non-steroidal anti-inflammatories (NSAID): Secondary | ICD-10-CM | POA: Insufficient documentation

## 2016-09-27 DIAGNOSIS — R519 Headache, unspecified: Secondary | ICD-10-CM

## 2016-09-27 DIAGNOSIS — R51 Headache: Secondary | ICD-10-CM

## 2016-09-27 DIAGNOSIS — I10 Essential (primary) hypertension: Secondary | ICD-10-CM

## 2016-09-27 DIAGNOSIS — Z5321 Procedure and treatment not carried out due to patient leaving prior to being seen by health care provider: Secondary | ICD-10-CM | POA: Insufficient documentation

## 2016-09-27 DIAGNOSIS — R112 Nausea with vomiting, unspecified: Secondary | ICD-10-CM | POA: Insufficient documentation

## 2016-09-27 DIAGNOSIS — Z79899 Other long term (current) drug therapy: Secondary | ICD-10-CM | POA: Insufficient documentation

## 2016-09-27 DIAGNOSIS — F17210 Nicotine dependence, cigarettes, uncomplicated: Secondary | ICD-10-CM | POA: Insufficient documentation

## 2016-09-27 MED ORDER — SODIUM CHLORIDE 0.9 % IV BOLUS (SEPSIS)
1000.0000 mL | Freq: Once | INTRAVENOUS | Status: AC
  Administered 2016-09-28: 1000 mL via INTRAVENOUS

## 2016-09-27 MED ORDER — KETOROLAC TROMETHAMINE 30 MG/ML IJ SOLN
30.0000 mg | Freq: Once | INTRAMUSCULAR | Status: AC
  Administered 2016-09-28: 30 mg via INTRAVENOUS
  Filled 2016-09-27: qty 1

## 2016-09-27 MED ORDER — METOCLOPRAMIDE HCL 10 MG PO TABS
10.0000 mg | ORAL_TABLET | Freq: Once | ORAL | Status: AC
  Administered 2016-09-28: 10 mg via ORAL
  Filled 2016-09-27: qty 1

## 2016-09-27 MED ORDER — DIPHENHYDRAMINE HCL 50 MG/ML IJ SOLN
25.0000 mg | Freq: Once | INTRAMUSCULAR | Status: AC
  Administered 2016-09-28: 25 mg via INTRAVENOUS
  Filled 2016-09-27: qty 1

## 2016-09-27 NOTE — ED Notes (Signed)
Patient called to room for second time, no answer.

## 2016-09-27 NOTE — ED Provider Notes (Signed)
[facility] Provider Note   CSN: [id number] Arrival date & time: 09/27/16  1829     History   Chief Complaint Chief Complaint  Patient presents with  . Headache    HPI [name] is a 41 y.o. female with history of GERD who presents with a headache has been persistent over the past week. Patient states that it is a frontal headache. She describes the pain as throbbing. She states that it is present most of the day. She has had light sensitivity and associated nausea and vomiting. Patient states she vomited 3 times today. Patient was evaluated 2 days ago and had vomiting on that day as well. Patient had a CT head that was negative and symptoms were relieved with a headache cocktail. Patient was discharged home with Toradol and Phenergan that has not helped. Patient is concerned that it is due to her blood pressure elevation. Patient has not been diagnosed with high blood pressure in the past, however has a family history of hypertension. Patient does not have a primary care provider. Patient denies any fevers, chest pain, shortness of breath, abdominal pain, urinary symptoms, vision changes.  HPI  Past Medical History:  Diagnosis Date  . Abnormal Pap smear of cervix 02/05/2016  . Encounter for Nexplanon removal 01/02/2016  . GERD (gastroesophageal reflux disease)   . Patient desires pregnancy 02/01/2016    Patient Active Problem List   Diagnosis Date Noted  . Abnormal Pap smear of cervix 02/05/2016  . Patient desires pregnancy 02/01/2016  . Encounter for Nexplanon removal 01/02/2016  . ACUTE BRONCHITIS 09/05/2009  . NICOTINE ADDICTION 02/06/2009  . ALLERGIC RHINITIS CAUSE UNSPECIFIED 11/12/2007  . HIP PAIN, RIGHT 11/12/2007  . BACK PAIN, CHRONIC, INTERMITTENT 11/12/2007    History reviewed. No pertinent surgical history.  OB History    Gravida Para Term Preterm AB Living   4 3 3   1 3    SAB TAB Ectopic Multiple Live Births   1       3       Home Medications      Prior to Admission medications   Medication Sig Start Date End Date Taking? Authorizing Provider  acetaminophen (TYLENOL) 500 MG tablet Take 1 tablet (500 mg total) by mouth every 6 (six) hours as needed. 09/28/16   Waylan Boga Shailah Gibbins, PA-C  amLODipine (NORVASC) 5 MG tablet Take 1 tablet (5 mg total) by mouth daily. 09/28/16   Emi Holes, PA-C  ibuprofen (ADVIL,MOTRIN) 200 MG tablet Take 400 mg by mouth every 6 (six) hours as needed.    Historical Provider, MD  ketorolac (TORADOL) 10 MG tablet Take 1 tablet (10 mg total) by mouth every 6 (six) hours as needed. 09/24/16   Jacalyn Lefevre, MD  Omeprazole (PRILOSEC PO) Take 20 mg by mouth daily.     Historical Provider, MD  promethazine (PHENERGAN) 25 MG tablet Take 1 tablet (25 mg total) by mouth every 6 (six) hours as needed for nausea or vomiting. 09/24/16   Jacalyn Lefevre, MD    Family History Family History  Problem Relation Age of Onset  . Cancer Maternal Grandmother   . Diabetes Maternal Grandmother   . Hypertension Maternal Grandmother   . Alcoholism Maternal Grandfather   . Cirrhosis Maternal Grandfather   . Diabetes Mother   . Hypertension Mother   . Cleft lip Son   . Cleft palate Son     Social History Social History  Substance Use Topics  . Smoking status: Current Every  Day Smoker    Packs/day: 0.50    Years: 22.00    Types: Cigarettes  . Smokeless tobacco: Never Used  . Alcohol use 0.0 oz/week     Comment: "a glass of wine daily"     Allergies   Patient has no known allergies.   Review of Systems Review of Systems  Constitutional: Negative for chills and fever.  HENT: Negative for facial swelling and sore throat.   Eyes: Positive for photophobia. Negative for visual disturbance.  Respiratory: Negative for cough and shortness of breath.   Cardiovascular: Negative for chest pain.  Gastrointestinal: Positive for nausea and vomiting. Negative for abdominal pain.  Genitourinary: Negative for dysuria.   Musculoskeletal: Negative for back pain.  Skin: Negative for rash and wound.  Neurological: Positive for headaches.  Psychiatric/Behavioral: The patient is not nervous/anxious.      Physical Exam Updated Vital Signs BP 174/82 (BP Location: Right Arm)   Pulse (!) 52   Temp 98.4 F (36.9 C) (Oral)   Resp 20   Wt 81.6 kg   LMP 09/09/2016   SpO2 98%   BMI 29.05 kg/m   Physical Exam  Constitutional: She appears well-developed and well-nourished. No distress.  HENT:  Head: Normocephalic and atraumatic.  Mouth/Throat: Oropharynx is clear and moist. No oropharyngeal exudate.  Eyes: Conjunctivae and EOM are normal. Pupils are equal, round, and reactive to light. Right eye exhibits no discharge. Left eye exhibits no discharge. No scleral icterus.  Neck: Normal range of motion. Neck supple. No thyromegaly present.  Cardiovascular: Normal rate, regular rhythm, normal heart sounds and intact distal pulses.  Exam reveals no gallop and no friction rub.   No murmur heard. Pulmonary/Chest: Effort normal and breath sounds normal. No stridor. No respiratory distress. She has no wheezes. She has no rales.  Abdominal: Soft. Bowel sounds are normal. She exhibits no distension. There is no tenderness. There is no rebound and no guarding.  Musculoskeletal: She exhibits no edema.  Lymphadenopathy:    She has no cervical adenopathy.  Neurological: She is alert. Coordination normal.  CN 3-12 intact; normal sensation throughout; 5/5 strength in all 4 extremities; equal bilateral grip strength  Skin: Skin is warm and dry. No rash noted. She is not diaphoretic. No pallor.  Psychiatric: She has a normal mood and affect.  Nursing note and vitals reviewed.    ED Treatments / Results  Labs (all labs ordered are listed, but only abnormal results are displayed) Labs Reviewed - No data to display  EKG  EKG Interpretation None       Radiology No results found.  Procedures Procedures (including  critical care time)  Medications Ordered in ED Medications  sodium chloride 0.9 % bolus 1,000 mL (0 mLs Intravenous Stopped 09/28/16 0135)  ketorolac (TORADOL) 30 MG/ML injection 30 mg (30 mg Intravenous Given 09/28/16 0022)  metoCLOPramide (REGLAN) tablet 10 mg (10 mg Oral Given 09/28/16 0022)  diphenhydrAMINE (BENADRYL) injection 25 mg (25 mg Intravenous Given 09/28/16 0022)  dexamethasone (DECADRON) injection 10 mg (10 mg Intravenous Given 09/28/16 0131)  amLODipine (NORVASC) tablet 5 mg (5 mg Oral Given 09/28/16 0135)     Initial Impression / Assessment and Plan / ED Course  I have reviewed the triage vital signs and the nursing notes.  Pertinent labs & imaging results that were available during my care of the patient were reviewed by me and considered in my medical decision making (see chart for details).     Pt HA treated and improved  while in ED, however blood pressure only reduced from 189/83 to 174/84. Presentation is like pts typical HA and non concerning [facility], ICH, Meningitis, or temporal arteritis. Pt is afebrile with no focal neuro deficits, nuchal rigidity, or change in vision. Considering blood pressure elevation today and 2 days ago, will initiate amlodipine. Patient is to follow up at health department or free clinic to establish care with a primary care provider. Patient advised that we cannot follow her blood pressure and she will need a recheck and refills at primary care provider. Return precautions discussed. Patient understands and agrees with plan. Patient discharged in satisfactory condition. I discussed patient case with Dr. Lynelle DoctorKnapp who guided the patient's management and agrees with plan.   Final Clinical Impressions(s) / ED Diagnoses   Final diagnoses:  Bad headache  Hypertension, unspecified type    New Prescriptions New Prescriptions   ACETAMINOPHEN (TYLENOL) 500 MG TABLET    Take 1 tablet (500 mg total) by mouth every 6 (six) hours as needed.   AMLODIPINE  (NORVASC) 5 MG TABLET    Take 1 tablet (5 mg total) by mouth daily.     Emi HolesAlexandra M Shreena Baines, PA-C 09/28/16 [id number]    Devoria AlbeIva Knapp, MD 09/28/16 [phone]

## 2016-09-27 NOTE — ED Triage Notes (Signed)
Pt states that she was seen this week for a headache that has not gotten any better.  Pt states "I think they need to give me something to bring my blood pressure down"

## 2016-09-27 NOTE — ED Notes (Signed)
Called to bring back to room, no answer.

## 2016-09-27 NOTE — ED Triage Notes (Addendum)
Patient c/o headache with nausea. Per patient sensitivity to light. Patient reports being seen here in ER for headache this week with no improvement. Patient reports feeling light headed since the binging of the week. Patient reports blood pressure being elevated and is requesting something for blood pressure. Denies hx of HTN.

## 2016-09-27 NOTE — ED Notes (Signed)
Called pt to go back to room with no answer.  

## 2016-09-28 MED ORDER — AMLODIPINE BESYLATE 5 MG PO TABS: 5.0000 mg | ORAL_TABLET | Freq: Every day | ORAL | 0 refills | Status: DC

## 2016-09-28 MED ORDER — ACETAMINOPHEN 500 MG PO TABS: 500.0000 mg | ORAL_TABLET | Freq: Four times a day (QID) | ORAL | 0 refills | Status: DC | PRN

## 2016-09-28 MED ORDER — AMLODIPINE BESYLATE 5 MG PO TABS
5.0000 mg | ORAL_TABLET | Freq: Once | ORAL | Status: AC
  Administered 2016-09-28: 5 mg via ORAL
  Filled 2016-09-28: qty 1

## 2016-09-28 MED ORDER — DEXAMETHASONE SODIUM PHOSPHATE 4 MG/ML IJ SOLN
10.0000 mg | Freq: Once | INTRAMUSCULAR | Status: AC
  Administered 2016-09-28: 10 mg via INTRAVENOUS
  Filled 2016-09-28: qty 3

## 2016-09-28 NOTE — ED Notes (Signed)
Pt states understanding of care given and follow up instructions.  Pt ambulated from ED with a steady gait, a/o

## 2016-09-28 NOTE — Discharge Instructions (Signed)
Medications: amlodipine, Tylenol  Treatment: Begin taking amlodipine once daily for your blood pressure. You can take acetaminophen as prescribed and/or the medications prescribed at last visit as needed if you continue to have headaches. Make sure to drink plenty of water.   Follow-up: Please follow up and establish care at the Health Department or [facility] for further evaluation and treatment of your high blood pressure and headaches if they continue. Please return to the emergency department if you develop any new or worsening symptoms.

## 2016-10-13 IMAGING — MR MR ANKLE*L* W/O CM
5 series · 40 of 40 positions shown · non-contrast
Comparison: None.

CLINICAL DATA: Diffuse left ankle pain and swelling since a fall
approximately 2 months ago. Initial encounter.

EXAM:
MRI OF THE LEFT ANKLE WITHOUT CONTRAST
TECHNIQUE: Multiplanar, multisequence MR imaging of the ankle was performed. No
intravenous contrast was administered.

[Series 3: pdfs axial · axial · 3.0mm · 0.47mm/px · z∈[-77,+45]mm · 10 of 35 slices shown]
[im 1/35]
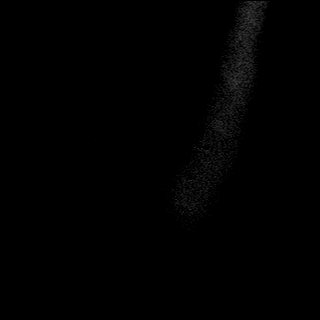
[im 4/35]
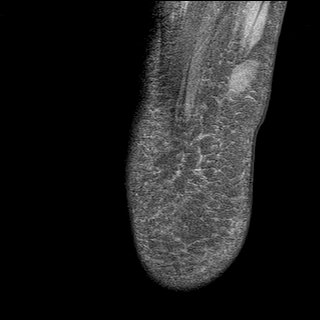
[im 8/35]
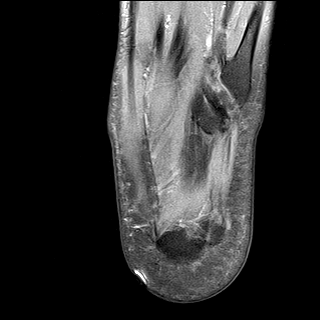
[im 12/35]
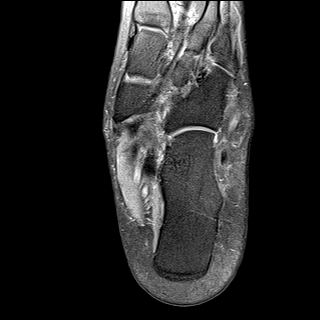
[im 16/35]
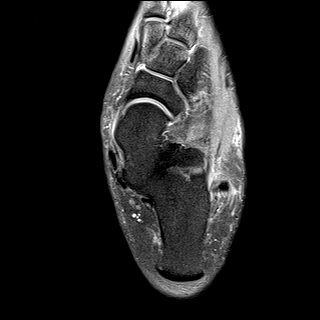
[im 19/35]
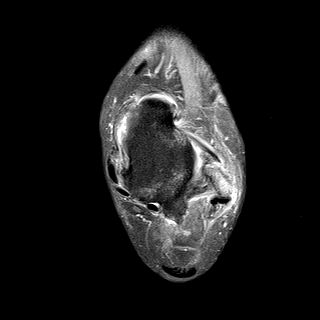
[im 23/35]
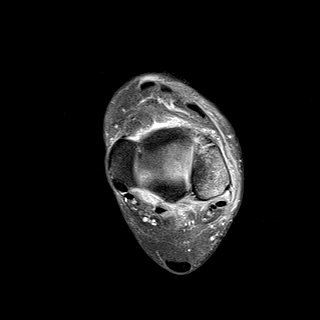
[im 27/35]
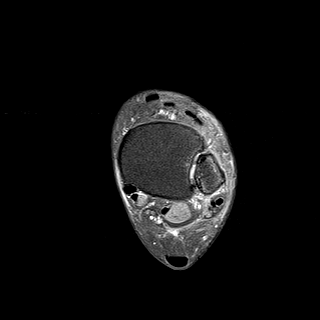
[im 31/35]
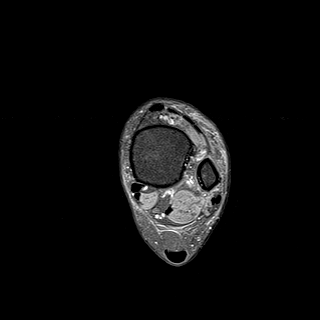
[im 35/35]
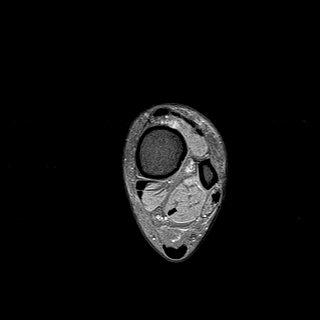

[Series 4: t2fs axial · axial · 3.0mm · 0.47mm/px · z∈[-77,+45]mm · 9 of 35 slices shown]
[im 1/35]
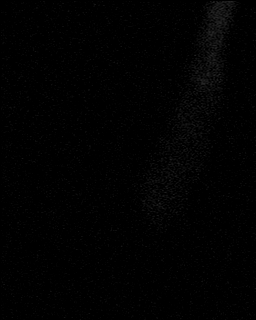
[im 5/35]
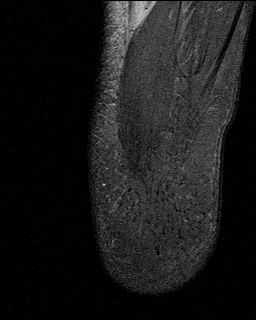
[im 9/35]
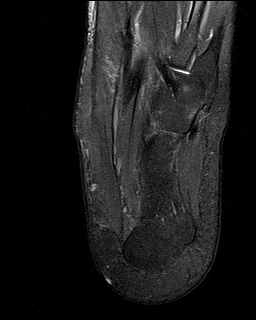
[im 13/35]
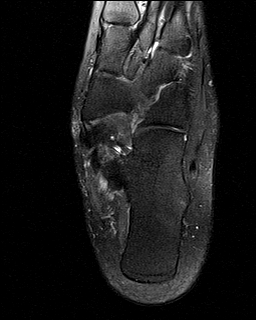
[im 18/35]
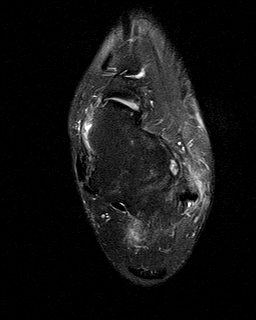
[im 22/35]
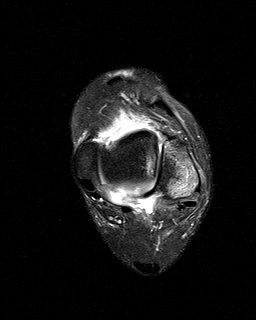
[im 26/35]
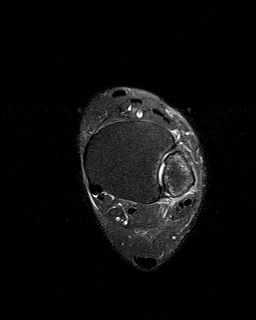
[im 30/35]
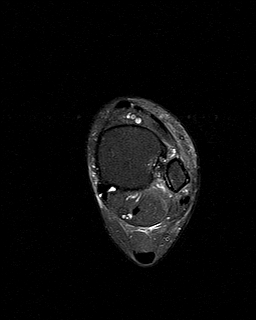
[im 35/35]
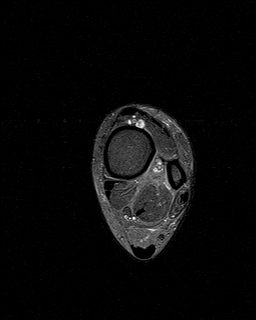

[Series 5: t2fs cor · coronal · 3.0mm · 0.47mm/px · 9 of 35 slices shown]
[im 1/35]
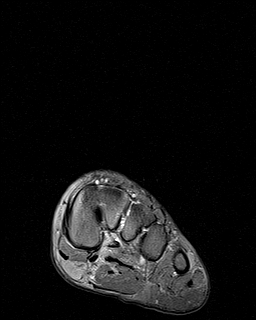
[im 5/35]
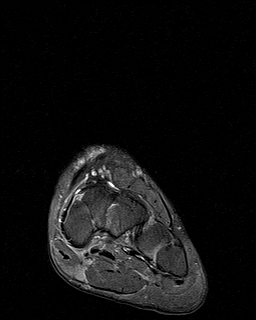
[im 9/35]
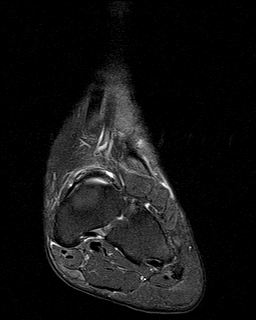
[im 13/35]
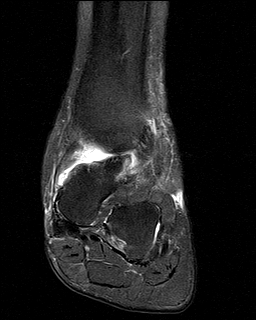
[im 18/35]
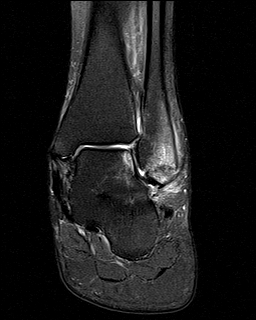
[im 22/35]
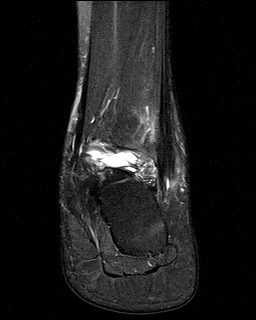
[im 26/35]
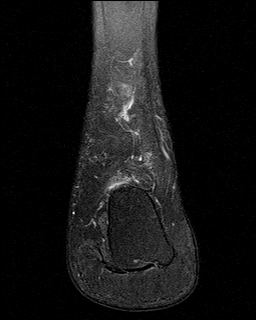
[im 30/35]
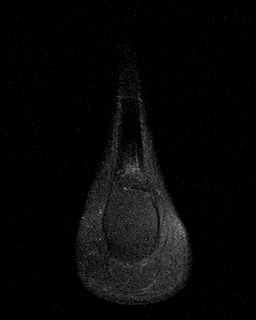
[im 35/35]
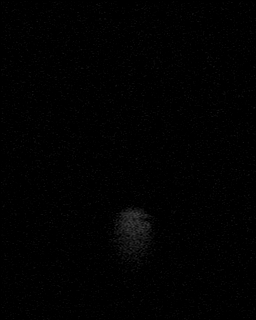

[Series 6: t2fs sag · sagittal · 3.0mm · 0.47mm/px · 6 of 23 slices shown]
[im 1/23]
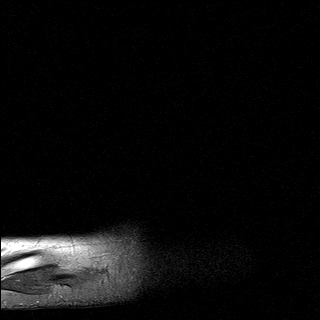
[im 5/23]
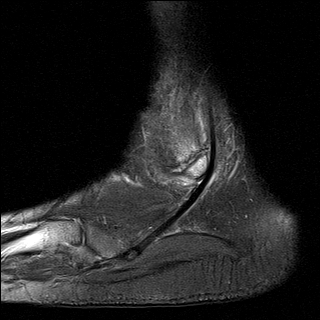
[im 9/23]
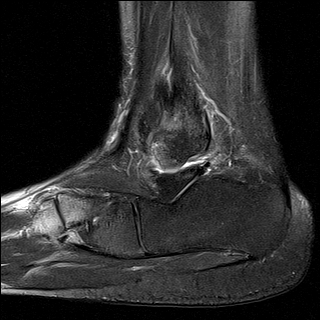
[im 14/23]
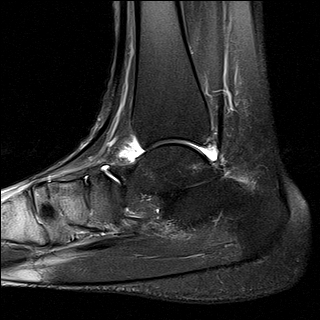
[im 18/23]
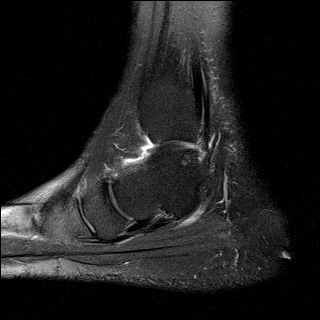
[im 23/23]
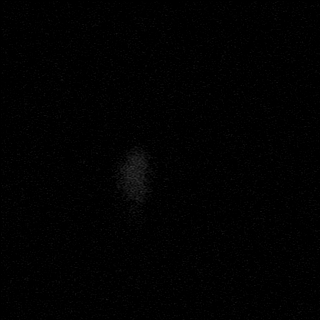

[Series 7: T1 · sagittal · 3.0mm · 0.39mm/px · 6 of 23 slices shown]
[im 1/23]
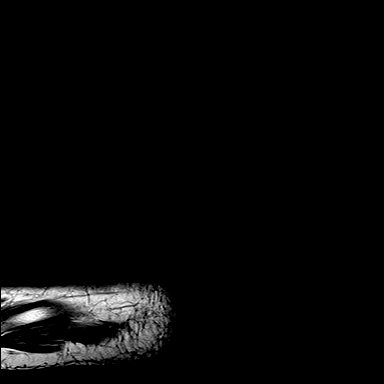
[im 5/23]
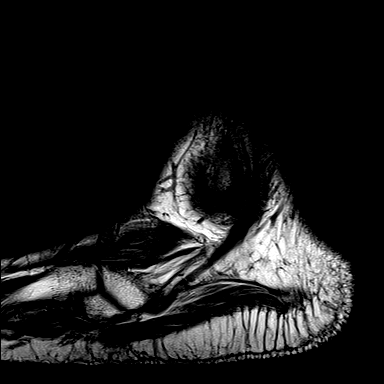
[im 9/23]
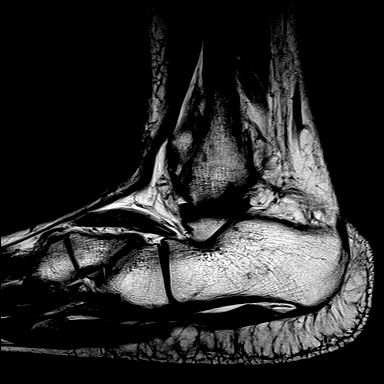
[im 14/23]
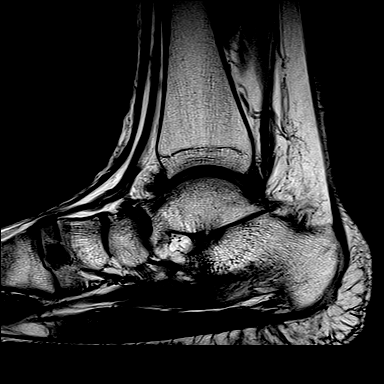
[im 18/23]
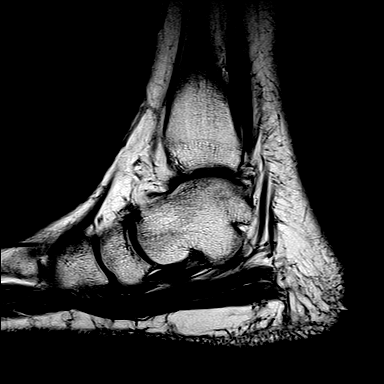
[im 23/23]
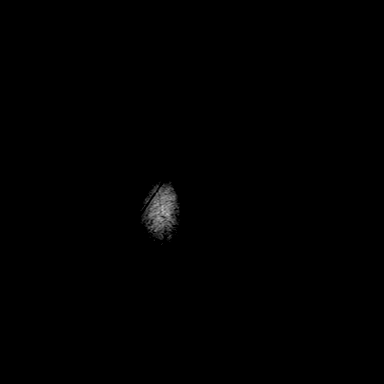

[40 of 40 positions shown; findings below may reference images not displayed]

FINDINGS: TENDONS

Peroneal: Intact.

Posteromedial: Intact.

Anterior: Intact.

Achilles: Intact.

Plantar Fascia: Unremarkable.

LIGAMENTS

Lateral: Intact.

Medial: Intact.

CARTILAGE

Ankle Joint: Small joint effusion is seen.

Subtalar Joints/Sinus Tarsi: Unremarkable.

Bones: There is marrow edema about a nondisplaced fracture of the
distal fibula. Fracture fragment off the tip of the fibula measures
0.7 cm craniocaudal by 0.7 cm transverse by 1.4 cm AP. The fracture
fragment demonstrates decreased signal on T1 weighted imaging the
majority of the fragment is T2 hyperintense although the distal most
portion of the fragment is T2 hypointense. There is marrow edema in
the adjacent fibula.

The patient also has an osteochondral lesion in the periphery of the
medial talar dome measuring 1 cm AP x 0.6 cm craniocaudal with
associated marrow edema. The cortex of the periphery of the tailor
dome appears disrupted as seen on images 19-21 of series 5. No fluid
undermining and bony fragment is identified. Overlying hyaline
cartilage is thinned and irregular. Bone marrow signal is otherwise
unremarkable.
IMPRESSION: Nondisplaced fracture of the tip of the distal fibula as described
above with associated marrow edema consistent with subacute injury.
Decreased T1 and T2 signal in the distal most aspect of the fracture
fragment may be due to osteonecrosis.

Osteochondral lesion in the periphery of the medial talar dome with
a small flap of disrupted cortex. No bone fragment instability is
identified.

Negative for tendon or ligament tear.

## 2017-07-29 ENCOUNTER — Encounter: Payer: Self-pay | Admitting: Women's Health

## 2017-07-29 ENCOUNTER — Ambulatory Visit (INDEPENDENT_AMBULATORY_CARE_PROVIDER_SITE_OTHER): Payer: Medicaid Other | Admitting: Women's Health

## 2017-07-29 VITALS — BP 130/80 | HR 94 | Ht 65.2 in | Wt 168.0 lb

## 2017-07-29 DIAGNOSIS — Z3201 Encounter for pregnancy test, result positive: Secondary | ICD-10-CM

## 2017-07-29 DIAGNOSIS — N926 Irregular menstruation, unspecified: Secondary | ICD-10-CM

## 2017-07-29 DIAGNOSIS — Z3A01 Less than 8 weeks gestation of pregnancy: Secondary | ICD-10-CM

## 2017-07-29 DIAGNOSIS — Z3491 Encounter for supervision of normal pregnancy, unspecified, first trimester: Secondary | ICD-10-CM

## 2017-07-29 LAB — POCT URINE PREGNANCY: Preg Test, Ur: POSITIVE — AB

## 2017-07-29 NOTE — Patient Instructions (Signed)
[name], I greatly value your feedback.  If you receive a survey following your visit with us today, we appreciate you taking the time to fill it out.  Thanks, [name] Maydell Knoebel, CNM, WHNP-BC   Nausea & Vomiting  Have saltine crackers or pretzels by your bed and eat a few bites before you raise your head out of bed in the morning  Eat small frequent meals throughout the day instead of large meals  Drink plenty of fluids throughout the day to stay hydrated, just don't drink a lot of fluids with your meals.  This can make your stomach fill up faster making you feel sick  Do not brush your teeth right after you eat  Products with real ginger are good for nausea, like ginger ale and ginger hard candy Make sure it says made with real ginger!  Sucking on sour candy like lemon heads is also good for nausea  If your prenatal vitamins make you nauseated, take them at night so you will sleep through the nausea  Sea Bands  If you feel like you need medicine for the nausea & vomiting please let us know  If you are unable to keep any fluids or food down please let us know   Constipation  Drink plenty of fluid, preferably water, throughout the day  Eat foods high in fiber such as fruits, vegetables, and grains  Exercise, such as walking, is a good way to keep your bowels regular  Drink warm fluids, especially warm prune juice, or decaf coffee  Eat a 1/2 cup of real oatmeal (not instant), 1/2 cup applesauce, and 1/2-1 cup warm prune juice every day  If needed, you may take Colace (docusate sodium) stool softener once or twice a day to help keep the stool soft. If you are pregnant, wait until you are out of your first trimester (12-14 weeks of pregnancy)  If you still are having problems with constipation, you may take Miralax once daily as needed to help keep your bowels regular.  If you are pregnant, wait until you are out of your first trimester (12-14 weeks of pregnancy)   First  Trimester of Pregnancy The first trimester of pregnancy is from week 1 until the end of week 12 (months 1 through 3). A week after a sperm fertilizes an egg, the egg will implant on the wall of the uterus. This embryo will begin to develop into a baby. Genes from you and your partner are forming the baby. The female genes determine whether the baby is a boy or a girl. At 6-8 weeks, the eyes and face are formed, and the heartbeat can be seen on ultrasound. At the end of 12 weeks, all the baby's organs are formed.  Now that you are pregnant, you will want to do everything you can to have a healthy baby. Two of the most important things are to get good prenatal care and to follow your health care provider's instructions. Prenatal care is all the medical care you receive before the baby's birth. This care will help prevent, find, and treat any problems during the pregnancy and childbirth. BODY CHANGES Your body goes through many changes during pregnancy. The changes vary from woman to woman.   You may gain or lose a couple of pounds at first.  You may feel sick to your stomach (nauseous) and throw up (vomit). If the vomiting is uncontrollable, call your health care provider.  You may tire easily.  You may develop headaches that  can be relieved by medicines approved by your health care provider.  You may urinate more often. Painful urination may mean you have a bladder infection.  You may develop heartburn as a result of your pregnancy.  You may develop constipation because certain hormones are causing the muscles that push waste through your intestines to slow down.  You may develop hemorrhoids or swollen, bulging veins (varicose veins).  Your breasts may begin to grow larger and become tender. Your nipples may stick out more, and the tissue that surrounds them (areola) may become darker.  Your gums may bleed and may be sensitive to brushing and flossing.  Dark spots or blotches (chloasma, mask  of pregnancy) may develop on your face. This will likely fade after the baby is born.  Your menstrual periods will stop.  You may have a loss of appetite.  You may develop cravings for certain kinds of food.  You may have changes in your emotions from day to day, such as being excited to be pregnant or being concerned that something may go wrong with the pregnancy and baby.  You may have more vivid and strange dreams.  You may have changes in your hair. These can include thickening of your hair, rapid growth, and changes in texture. Some women also have hair loss during or after pregnancy, or hair that feels dry or thin. Your hair will most likely return to normal after your baby is born. WHAT TO EXPECT AT YOUR PRENATAL VISITS During a routine prenatal visit:  You will be weighed to make sure you and the baby are growing normally.  Your blood pressure will be taken.  Your abdomen will be measured to track your baby's growth.  The fetal heartbeat will be listened to starting around week 10 or 12 of your pregnancy.  Test results from any previous visits will be discussed. Your health care provider may ask you:  How you are feeling.  If you are feeling the baby move.  If you have had any abnormal symptoms, such as leaking fluid, bleeding, severe headaches, or abdominal cramping.  If you have any questions. Other tests that may be performed during your first trimester include:  Blood tests to find your blood type and to check for the presence of any previous infections. They will also be used to check for low iron levels (anemia) and Rh antibodies. Later in the pregnancy, blood tests for diabetes will be done along with other tests if problems develop.  Urine tests to check for infections, diabetes, or protein in the urine.  An ultrasound to confirm the proper growth and development of the baby.  An amniocentesis to check for possible genetic problems.  Fetal screens for spina  bifida and Down syndrome.  You may need other tests to make sure you and the baby are doing well. HOME CARE INSTRUCTIONS  Medicines  Follow your health care provider's instructions regarding medicine use. Specific medicines may be either safe or unsafe to take during pregnancy.  Take your prenatal vitamins as directed.  If you develop constipation, try taking a stool softener if your health care provider approves. Diet  Eat regular, well-balanced meals. Choose a variety of foods, such as meat or vegetable-based protein, fish, milk and low-fat dairy products, vegetables, fruits, and whole grain breads and cereals. Your health care provider will help you determine the amount of weight gain that is right for you.  Avoid raw meat and uncooked cheese. These carry germs that can cause  birth defects in the baby.  Eating four or five small meals rather than three large meals a day may help relieve nausea and vomiting. If you start to feel nauseous, eating a few soda crackers can be helpful. Drinking liquids between meals instead of during meals also seems to help nausea and vomiting.  If you develop constipation, eat more high-fiber foods, such as fresh vegetables or fruit and whole grains. Drink enough fluids to keep your urine clear or pale yellow. Activity and Exercise  Exercise only as directed by your health care provider. Exercising will help you:  Control your weight.  Stay in shape.  Be prepared for labor and delivery.  Experiencing pain or cramping in the lower abdomen or low back is a good sign that you should stop exercising. Check with your health care provider before continuing normal exercises.  Try to avoid standing for long periods of time. Move your legs often if you must stand in one place for a long time.  Avoid heavy lifting.  Wear low-heeled shoes, and practice good posture.  You may continue to have sex unless your health care provider directs you  otherwise. Relief of Pain or Discomfort  Wear a good support bra for breast tenderness.   Take warm sitz baths to soothe any pain or discomfort caused by hemorrhoids. Use hemorrhoid cream if your health care provider approves.   Rest with your legs elevated if you have leg cramps or low back pain.  If you develop varicose veins in your legs, wear support hose. Elevate your feet for 15 minutes, 3-4 times a day. Limit salt in your diet. Prenatal Care  Schedule your prenatal visits by the twelfth week of pregnancy. They are usually scheduled monthly at first, then more often in the last 2 months before delivery.  Write down your questions. Take them to your prenatal visits.  Keep all your prenatal visits as directed by your health care provider. Safety  Wear your seat belt at all times when driving.  Make a list of emergency phone numbers, including numbers for family, friends, the hospital, and police and fire departments. General Tips  Ask your health care provider for a referral to a local prenatal education class. Begin classes no later than at the beginning of month 6 of your pregnancy.  Ask for help if you have counseling or nutritional needs during pregnancy. Your health care provider can offer advice or refer you to specialists for help with various needs.  Do not use hot tubs, steam rooms, or saunas.  Do not douche or use tampons or scented sanitary pads.  Do not cross your legs for long periods of time.  Avoid cat litter boxes and soil used by cats. These carry germs that can cause birth defects in the baby and possibly loss of the fetus by miscarriage or stillbirth.  Avoid all smoking, herbs, alcohol, and medicines not prescribed by your health care provider. Chemicals in these affect the formation and growth of the baby.  Schedule a dentist appointment. At home, brush your teeth with a soft toothbrush and be gentle when you floss. SEEK MEDICAL CARE IF:   You have  dizziness.  You have mild pelvic cramps, pelvic pressure, or nagging pain in the abdominal area.  You have persistent nausea, vomiting, or diarrhea.  You have a bad smelling vaginal discharge.  You have pain with urination.  You notice increased swelling in your face, hands, legs, or ankles. SEEK IMMEDIATE MEDICAL CARE IF:  You have a fever.  You are leaking fluid from your vagina.  You have spotting or bleeding from your vagina.  You have severe abdominal cramping or pain.  You have rapid weight gain or loss.  You vomit blood or material that looks like coffee grounds.  You are exposed to [place] measles and have never had them.  You are exposed to fifth disease or chickenpox.  You develop a severe headache.  You have shortness of breath.  You have any kind of trauma, such as from a fall or a car accident. Document Released: 07/29/2001 Document Revised: 12/19/2013 Document Reviewed: 06/14/2013 [facility] Patient Information 2015 [place], [place]. This information is not intended to replace advice given to you by your health care provider. Make sure you discuss any questions you have with your health care provider.

## 2017-07-29 NOTE — Progress Notes (Signed)
   GYN VISIT Patient name: [name] MRN [id number]  Date of birth: [birth year] Chief Complaint:   Late Period (pregnancy test)  History of Present Illness:   [name] is a 41 y.o. 15P3013 African American female at 1291w1d by uncertain period, being seen today for +HPT. Denies n/v. Is taking flinstone gummies-doesn't like pnv b/c they are too big, however is taking 3 b/c the bottle said 2 for kids, so she thought she needed more. Was rx'd norvasc in Feb at ED for HTN, however only took it about 1 month then stopped. Denies h/o CHTN prior to this or HTN during pregnancies.      Patient's last menstrual period was 06/09/2017. Review of Systems:   Pertinent items are noted in HPI Denies fever/chills, dizziness, headaches, visual disturbances, fatigue, shortness of breath, chest pain, abdominal pain, vomiting, abnormal vaginal discharge/itching/odor/irritation, problems with periods, bowel movements, urination, or intercourse unless otherwise stated above.  Pertinent History Reviewed:  Reviewed past medical,surgical, social, obstetrical and family history.  Reviewed problem list, medications and allergies. Physical Assessment:   Vitals:   07/29/17 1157  BP: 130/80  Pulse: 94  Weight: 168 lb (76.2 kg)  Height: 5' 5.2" (1.656 m)  Body mass index is 27.79 kg/m.       Physical Examination:   General appearance: alert, well appearing, and in no distress  Mental status: alert, oriented to person, place, and time  Skin: warm & dry   Cardiovascular: normal heart rate noted  Respiratory: normal respiratory effort, no distress  Abdomen: soft, non-tender   Pelvic: examination not indicated  Extremities: no edema   Results for orders placed or performed in visit on 07/29/17 (from the past 24 hour(s))  POCT urine pregnancy   Collection Time: 07/29/17 12:05 PM  Result Value Ref Range   Preg Test, Ur Positive (A) Negative    Assessment & Plan:  1) [id number] @ 1991w1d pregnant by uncertain  LMP> will get dating u/s. Take 2 flinstone gummies daily instead of 3. Let us know when gets preg mcaid and will rx small pnv.   2) AMA> discussed genetic screening  3) CHTN> no meds currently, on review of chart has had multiple elevated bp's since 2017, will monitor bp's/need for meds  Orders Placed This Encounter  Procedures  . US OB Comp Less 14 Wks  . POCT urine pregnancy    Return for next dating u/s available.  Marge DuncansBooker, Claris Pech Randall CNM, [facility]NP[facility]BC 07/29/2017 12:27 PM

## 2017-08-01 ENCOUNTER — Encounter (HOSPITAL_COMMUNITY): Payer: Self-pay | Admitting: *Deleted

## 2017-08-01 ENCOUNTER — Emergency Department (HOSPITAL_COMMUNITY)
Admission: EM | Admit: 2017-08-01 | Discharge: 2017-08-01 | Disposition: A | Discharge status: Home / Self Care | Payer: Medicaid Other | Attending: Emergency Medicine | Admitting: Emergency Medicine

## 2017-08-01 DIAGNOSIS — F17210 Nicotine dependence, cigarettes, uncomplicated: Secondary | ICD-10-CM | POA: Insufficient documentation

## 2017-08-01 DIAGNOSIS — O99331 Smoking (tobacco) complicating pregnancy, first trimester: Secondary | ICD-10-CM | POA: Diagnosis not present

## 2017-08-01 DIAGNOSIS — N760 Acute vaginitis: Secondary | ICD-10-CM

## 2017-08-01 DIAGNOSIS — B9689 Other specified bacterial agents as the cause of diseases classified elsewhere: Secondary | ICD-10-CM

## 2017-08-01 DIAGNOSIS — Z3A08 8 weeks gestation of pregnancy: Secondary | ICD-10-CM | POA: Insufficient documentation

## 2017-08-01 DIAGNOSIS — O2341 Unspecified infection of urinary tract in pregnancy, first trimester: Secondary | ICD-10-CM | POA: Insufficient documentation

## 2017-08-01 DIAGNOSIS — O9989 Other specified diseases and conditions complicating pregnancy, childbirth and the puerperium: Secondary | ICD-10-CM | POA: Diagnosis present

## 2017-08-01 LAB — URINALYSIS, ROUTINE W REFLEX MICROSCOPIC
BACTERIA UA: NONE SEEN
BILIRUBIN URINE: NEGATIVE
GLUCOSE, UA: NEGATIVE mg/dL
HGB URINE DIPSTICK: NEGATIVE
KETONES UR: 5 mg/dL — AB
NITRITE: NEGATIVE
PROTEIN: 30 mg/dL — AB
Specific Gravity, Urine: 1.023 (ref 1.005–1.030)
pH: 5 (ref 5.0–8.0)

## 2017-08-01 LAB — WET PREP, GENITAL
Sperm: NONE SEEN
Trich, Wet Prep: NONE SEEN
YEAST WET PREP: NONE SEEN

## 2017-08-01 MED ORDER — CEPHALEXIN 500 MG PO CAPS: 500.0000 mg | ORAL_CAPSULE | Freq: Four times a day (QID) | ORAL | 0 refills | Status: DC

## 2017-08-01 MED ORDER — METRONIDAZOLE 500 MG PO TABS
500.0000 mg | ORAL_TABLET | Freq: Two times a day (BID) | ORAL | 0 refills | Status: DC
Start: 2017-08-01 — End: 2017-08-26

## 2017-08-01 NOTE — ED Notes (Signed)
Pt is [redacted] weeks pregnant  Complains of Vag itching  PA student in to assess

## 2017-08-01 NOTE — ED Provider Notes (Signed)
[facility] EMERGENCY DEPARTMENT Provider Note   CSN: [id number] Arrival date & time: 08/01/17  1245     History   Chief Complaint Chief Complaint  Patient presents with  . Vaginal Itching    HPI [name] is a 41 y.o. female.  HPI   [name] is a 41 y.o. female G5P3Ab1 at approx 8 w pregnant by uncertain LMP, who presents to the Emergency Department complaining of itching and irritation to her vagina for several days. She states the itching is continuous to the internal and external vagina.  Symptoms worse at night.  She denies any abdominal pain, rash, genital lesions, vaginal bleeding or discharge, or burning with urination.  [facility]  She has a follow-up appointment on 12/18 for ultrasound.  She denies other symptoms at present.  Past Medical History:  Diagnosis Date  . Abnormal Pap smear of cervix 02/05/2016  . Encounter for Nexplanon removal 01/02/2016  . GERD (gastroesophageal reflux disease)   . Patient desires pregnancy 02/01/2016    Patient Active Problem List   Diagnosis Date Noted  . Abnormal Pap smear of cervix 02/05/2016  . Patient desires pregnancy 02/01/2016  . Encounter for Nexplanon removal 01/02/2016  . ACUTE BRONCHITIS 09/05/2009  . NICOTINE ADDICTION 02/06/2009  . ALLERGIC RHINITIS CAUSE UNSPECIFIED 11/12/2007  . HIP PAIN, RIGHT 11/12/2007  . BACK PAIN, CHRONIC, INTERMITTENT 11/12/2007    History reviewed. No pertinent surgical history.  OB History    Gravida Para Term Preterm AB Living   5 3 3   1 3    SAB TAB Ectopic Multiple Live Births   1       3       Home Medications    Prior to Admission medications   Medication Sig Start Date End Date Taking? Authorizing Provider  acetaminophen (TYLENOL) 500 MG tablet Take 1 tablet (500 mg total) by mouth every 6 (six) hours as needed. Patient taking differently: Take 1,000 mg by mouth every 6 (six) hours as needed.  09/28/16   Yes Law, Waylan Boga, PA-C  flintstones complete (FLINTSTONES) 60 MG chewable tablet Chew 1 tablet by mouth daily.   Yes [provider]    Family History Family History  Problem Relation Age of Onset  . Cancer Maternal Grandmother   . Diabetes Maternal Grandmother   . Hypertension Maternal Grandmother   . Alcoholism Maternal Grandfather   . Cirrhosis Maternal Grandfather   . Diabetes Mother   . Hypertension Mother   . Cleft lip Son   . Cleft palate Son     Social History Social History   Tobacco Use  . Smoking status: Current Every Day Smoker    Packs/day: 0.50    Years: 22.00    Pack years: 11.00    Types: Cigarettes  . Smokeless tobacco: Never Used  Substance Use Topics  . Alcohol use: Yes    Alcohol/week: 0.0 oz    Comment: denies use 08/01/17  . Drug use: Yes    Types: Marijuana    Comment:  denies use 08/01/17     Allergies   Patient has no known allergies.   Review of Systems Review of Systems  Constitutional: Negative for activity change, appetite change, chills and fever.  Respiratory: Negative for chest tightness and shortness of breath.   Gastrointestinal: Negative for abdominal pain, diarrhea, nausea and vomiting.  Genitourinary: Negative for decreased urine volume, difficulty urinating, dysuria, flank pain, frequency,  genital sores, hematuria, urgency, vaginal bleeding and vaginal discharge.       Vaginal irritation and itching  Musculoskeletal: Negative for back pain.  Skin: Negative for rash.  Neurological: Negative for dizziness, weakness and numbness.  Hematological: Negative for adenopathy.  Psychiatric/Behavioral: Negative for confusion.  All other systems reviewed and are negative.    Physical Exam Updated Vital Signs BP 123/72 (BP Location: Right Arm)   Pulse 78   Temp 98.5 F (36.9 C) (Oral)   Resp 18   Ht 5\' 5"  (1.651 m)   Wt 76.2 kg (168 lb)   LMP 06/09/2017   SpO2 98%   BMI 27.96 kg/m   Physical Exam    Constitutional: She is oriented to person, place, and time. She appears well-developed and well-nourished. No distress.  HENT:  Head: Atraumatic.  Mouth/Throat: Oropharynx is clear and moist.  Cardiovascular: Normal rate and normal heart sounds.  Pulmonary/Chest: Effort normal and breath sounds normal. No respiratory distress.  Abdominal: Soft. She exhibits no distension and no mass. There is no tenderness. There is no rebound and no guarding.  Genitourinary: There is no rash, tenderness or lesion on the right labia. There is no rash, tenderness or lesion on the left labia. Cervix exhibits no motion tenderness. Right adnexum displays no mass and no tenderness. Left adnexum displays no mass and no tenderness. No bleeding in the vagina. No foreign body in the vagina.  Genitourinary Comments: Normal-appearing external genitalia.  Moderate thick white vaginal discharge is present.   No adnexal tenderness or masses, no cervical motion tenderness, os is closed.  No vaginal bleeding.  Musculoskeletal: Normal range of motion.  Neurological: She is alert and oriented to person, place, and time.  Skin: Skin is warm. Capillary refill takes less than 2 seconds. No rash noted.  Psychiatric: She has a normal mood and affect.  Nursing note and vitals reviewed.    ED Treatments / Results  Labs (all labs ordered are listed, but only abnormal results are displayed) Labs Reviewed  URINALYSIS, ROUTINE W REFLEX MICROSCOPIC - Abnormal; Notable for the following components:      Result Value   APPearance HAZY (*)    Ketones, ur 5 (*)    Protein, ur 30 (*)    Leukocytes, UA LARGE (*)    Squamous Epithelial / LPF TOO NUMEROUS TO COUNT (*)    All other components within normal limits  WET PREP, GENITAL  URINE CULTURE  GC/CHLAMYDIA PROBE AMP ([place] NOT [facility]    EKG  EKG Interpretation None       Radiology No results found.  Procedures Procedures (including critical care  time)  Medications Ordered in ED Medications - No data to display   Initial Impression / Assessment and Plan / ED Course  I have reviewed the triage vital signs and the nursing notes.  Pertinent labs & imaging results that were available during my care of the patient were reviewed by me and considered in my medical decision making (see chart for details).    Patient well-appearing.  No obvious genital lesions or rash.  Patient appears to have bacterial vaginosis by wet prep and possible UTI.  Urine culture is pending as well as GC and Chlamydia culture.  No abdominal tenderness vomiting, fever, or vaginal bleeding or pain.   Patient has appointment with her OB/GYN on Tuesday, 08/04/2017  Final Clinical Impressions(s) / ED Diagnoses   Final diagnoses:  Bacterial vaginosis  UTI (urinary tract infection) in pregnancy in  first trimester    ED Discharge Orders    None       Pauline Ausriplett, Allyn Bertoni, PA-C 08/01/17 1707    Vanetta MuldersZackowski, Scott, MD 08/02/17 [phone]

## 2017-08-01 NOTE — ED Notes (Signed)
TT in to reassess and discuss findings 

## 2017-08-01 NOTE — ED Triage Notes (Signed)
Pt c/o itching to vaginal area. Pt denies burning or discharge. Pt with sexual activity with husband few days ago.  Pt states she is almost [redacted] weeks pregnant.

## 2017-08-01 NOTE — ED Notes (Signed)
Spec to lab

## 2017-08-01 NOTE — Discharge Instructions (Signed)
You may take Tylenol every 4 hours if needed.  Keep your appointment with Dr. Emelda FearFerguson for Tuesday.  You may also use vagisil cream to the external vagina as directed if needed for itching.  Take the antibiotics as directed until they are completed.  Return to the ER for any worsening symptoms.

## 2017-08-03 LAB — URINE CULTURE

## 2017-08-03 LAB — GC/CHLAMYDIA PROBE AMP (~~LOC~~) NOT AT ARMC
Chlamydia: NEGATIVE
Neisseria Gonorrhea: NEGATIVE

## 2017-08-04 ENCOUNTER — Ambulatory Visit (INDEPENDENT_AMBULATORY_CARE_PROVIDER_SITE_OTHER): Payer: Medicaid Other

## 2017-08-04 DIAGNOSIS — Z3A08 8 weeks gestation of pregnancy: Secondary | ICD-10-CM | POA: Diagnosis not present

## 2017-08-04 DIAGNOSIS — Z3491 Encounter for supervision of normal pregnancy, unspecified, first trimester: Secondary | ICD-10-CM

## 2017-08-04 DIAGNOSIS — O3680X0 Pregnancy with inconclusive fetal viability, not applicable or unspecified: Secondary | ICD-10-CM

## 2017-08-04 NOTE — Progress Notes (Signed)
US 8 wks,single IUP w/ys,positive fht 167 bpm,normal ovaries bilat,crl 20.11 mm,EDD 03/16/2018 by LMP

## 2017-08-12 ENCOUNTER — Telehealth: Payer: Self-pay | Admitting: *Deleted

## 2017-08-12 NOTE — Telephone Encounter (Signed)
Patient states she is having itching but feels different. Thinks it could be yeast this time.  Informed patient that since she did take 2 antibiotics it could be yeast so she could try the over the counter Monistat or generic brand. Advised if it did not clear it up, she could be see for reevaluation. Verbalized understanding.

## 2017-08-18 NOTE — L&D Delivery Note (Signed)
[name]: Levonne Haun MRN: [id number]  GBS status: negative  Patient is a 42 y.o. now [id number] s/p NSVD at 4228w1d, who was admitted for PROM at 0300 today,  9h 352m prior to delivery. S/p IOL with one done with one dose of Cytotec at 0657 this morning. She progressed quickly to 2nd stage of labor, but started having recurrent variable decelerations, which got deeper with descent, so decision was made to do a vacuum-assisted vaginal delivery.   Operative Delivery Note At 12:01 PM a viable female was delivered via vacuum-assisted vaginal delivery.  APGAR: 9, 9; weight 5lb 14.2oz Placenta status: intact, sent to L&D.  Cord: 3-vessel    Cord pH: 7.25 (arterial)   Anesthesia:  Epidural Episiotomy: None Lacerations: None Est. Blood Loss (mL):  150  Patient pushing with good effort, at 0 station, 1+ with pushing; presentation: vertex; position LOA.   Verbal consent: obtained from patient.  Risks and benefits discussed in detail.  Risks include, but are not limited to the risks of anesthesia, bleeding, infection, damage to maternal tissues, fetal cephalhematoma.  There is also the risk of inability to effect vaginal delivery of the head, or shoulder dystocia that cannot be resolved by established maneuvers, leading to the need for emergency cesarean section.  The Kiwi Omnicup was positioned over the sagittal suture 3 cm anterior to posterior fontanelle.  Pressure was then increased to 500 mmHg, and the patient was instructed to push.  Pulling was administered along the pelvic curve.  2 pulls were administered during 1 contractions. No popoffs.  Fetal head delivered. Nuchal cord x1 noted and reduced. Shoulder and body delivered in usual fashion. Infant with spontaneous cry, placed on mother's abdomen, dried and bulb suctioned. Cord clamped x 2 after 1-minute delay, and cut by family member. Cord blood drawn. Placenta delivered spontaneously with gentle cord traction. Fundus firm with massage and Pitocin.  Perineum inspected and found to have just a left labial abrasion, but no lacerations.   Mom to postpartum.  Baby to Couplet care / Skin to Skin.  Raynelle FanningJulie P. Ephraim Reichel, MD OB Fellow 02/24/18, 12:32 PM

## 2017-08-23 ENCOUNTER — Other Ambulatory Visit: Payer: Self-pay

## 2017-08-23 ENCOUNTER — Emergency Department (HOSPITAL_COMMUNITY)
Admission: EM | Admit: 2017-08-23 | Discharge: 2017-08-24 | Disposition: A | Discharge status: Home / Self Care | Payer: Medicaid Other | Attending: Emergency Medicine | Admitting: Emergency Medicine

## 2017-08-23 ENCOUNTER — Encounter (HOSPITAL_COMMUNITY): Payer: Self-pay | Admitting: Emergency Medicine

## 2017-08-23 DIAGNOSIS — O200 Threatened abortion: Secondary | ICD-10-CM | POA: Diagnosis not present

## 2017-08-23 DIAGNOSIS — Z3A11 11 weeks gestation of pregnancy: Secondary | ICD-10-CM | POA: Diagnosis not present

## 2017-08-23 DIAGNOSIS — O9989 Other specified diseases and conditions complicating pregnancy, childbirth and the puerperium: Secondary | ICD-10-CM | POA: Diagnosis present

## 2017-08-23 DIAGNOSIS — R1032 Left lower quadrant pain: Secondary | ICD-10-CM | POA: Insufficient documentation

## 2017-08-23 DIAGNOSIS — O99331 Smoking (tobacco) complicating pregnancy, first trimester: Secondary | ICD-10-CM | POA: Insufficient documentation

## 2017-08-23 DIAGNOSIS — F17210 Nicotine dependence, cigarettes, uncomplicated: Secondary | ICD-10-CM | POA: Diagnosis not present

## 2017-08-23 LAB — COMPREHENSIVE METABOLIC PANEL
ALBUMIN: 3.9 g/dL (ref 3.5–5.0)
ALK PHOS: 49 U/L (ref 38–126)
ALT: 12 U/L — AB (ref 14–54)
ANION GAP: 12 (ref 5–15)
AST: 18 U/L (ref 15–41)
BILIRUBIN TOTAL: 0.1 mg/dL — AB (ref 0.3–1.2)
BUN: 11 mg/dL (ref 6–20)
CALCIUM: 10.2 mg/dL (ref 8.9–10.3)
CO2: 21 mmol/L — AB (ref 22–32)
CREATININE: 0.58 mg/dL (ref 0.44–1.00)
Chloride: 102 mmol/L (ref 101–111)
GFR calc Af Amer: >60 mL/min (ref >60)
GFR calc non Af Amer: >60 mL/min (ref >60)
GLUCOSE: 115 mg/dL — AB (ref 65–99)
Potassium: 3.7 mmol/L (ref 3.5–5.1)
Sodium: 135 mmol/L (ref 135–145)
TOTAL PROTEIN: 8.4 g/dL — AB (ref 6.5–8.1)

## 2017-08-23 LAB — URINALYSIS, ROUTINE W REFLEX MICROSCOPIC
Bilirubin Urine: NEGATIVE
GLUCOSE, UA: NEGATIVE mg/dL
Hgb urine dipstick: NEGATIVE
Ketones, ur: NEGATIVE mg/dL
Nitrite: NEGATIVE
PH: 6 (ref 5.0–8.0)
PROTEIN: 30 mg/dL — AB
SPECIFIC GRAVITY, URINE: 1.018 (ref 1.005–1.030)

## 2017-08-23 LAB — CBC
HCT: 35 % — ABNORMAL LOW (ref 36.0–46.0)
HEMOGLOBIN: 11.6 g/dL — AB (ref 12.0–15.0)
MCH: 32.5 pg (ref 26.0–34.0)
MCHC: 33.1 g/dL (ref 30.0–36.0)
MCV: 98 fL (ref 78.0–100.0)
PLATELETS: 355 10*3/uL (ref 150–400)
RBC: 3.57 MIL/uL — ABNORMAL LOW (ref 3.87–5.11)
RDW: 11.6 % (ref 11.5–15.5)
WBC: 10.5 10*3/uL (ref 4.0–10.5)

## 2017-08-23 LAB — HCG, QUANTITATIVE, PREGNANCY: hCG, Beta Chain, Quant, S: 69817 m[IU]/mL — ABNORMAL HIGH (ref <5)

## 2017-08-23 LAB — LIPASE, BLOOD: Lipase: 30 U/L (ref 11–51)

## 2017-08-23 NOTE — Discharge Instructions (Signed)
Workup here today was reassuring with the positive fetal heart tones.  Quantitative hCG was done for comparison by Dr. Emelda FearFerguson in his office on Wednesday.  Technically still concern for possible threatened miscarriage.  Urinalysis not consistent with urinary tract infection.

## 2017-08-23 NOTE — ED Triage Notes (Signed)
PT states she is [redacted] weeks gestation with 4th pregnancy and started having some lower abdominal pressure for the past 4 days. PT denies any vaginal discharge or urinary symptoms.

## 2017-08-24 NOTE — ED Provider Notes (Signed)
[facility] EMERGENCY DEPARTMENT Provider Note   CSN: [id number] Arrival date & time: 08/23/17  1805     History   Chief Complaint Chief Complaint  Patient presents with  . Abdominal Pain    HPI [name] is a 42 y.o. female.  Patient is [redacted] weeks gestation her due date is March 16, 2018.  This is gravida 5 para 3 for her.  She is followed by [facility] and has had a ultrasound done by them that showed a positive intrauterine pregnancy with fetal heart tones.  Patient is here for left lower quadrant abdominal pain and suprapubic pain.  That started today to be constant but prior to that it was on and off for a week.  No vaginal bleeding.  Patient has follow-up with OB/GYN scheduled for this Wednesday.        Past Medical History:  Diagnosis Date  . Abnormal Pap smear of cervix 02/05/2016  . Encounter for Nexplanon removal 01/02/2016  . GERD (gastroesophageal reflux disease)   . Patient desires pregnancy 02/01/2016    Patient Active Problem List   Diagnosis Date Noted  . Abnormal Pap smear of cervix 02/05/2016  . Patient desires pregnancy 02/01/2016  . Encounter for Nexplanon removal 01/02/2016  . ACUTE BRONCHITIS 09/05/2009  . NICOTINE ADDICTION 02/06/2009  . ALLERGIC RHINITIS CAUSE UNSPECIFIED 11/12/2007  . HIP PAIN, RIGHT 11/12/2007  . BACK PAIN, CHRONIC, INTERMITTENT 11/12/2007    History reviewed. No pertinent surgical history.  OB History    Gravida Para Term Preterm AB Living   5 3 3   1 3    SAB TAB Ectopic Multiple Live Births   1       3       Home Medications    Prior to Admission medications   Medication Sig Start Date End Date Taking? Authorizing Provider  acetaminophen (TYLENOL) 500 MG tablet Take 1 tablet (500 mg total) by mouth every 6 (six) hours as needed. Patient taking differently: Take 1,000 mg by mouth every 6 (six) hours as needed.  09/28/16  Yes Law, Waylan Boga, PA-C  flintstones complete (FLINTSTONES) 60 MG chewable tablet  Chew 1 tablet by mouth daily.   Yes [provider]  cephALEXin (KEFLEX) 500 MG capsule Take 1 capsule (500 mg total) by mouth 4 (four) times daily. Patient not taking: Reported on 08/23/2017 08/01/17   Triplett, Tammy, PA-C  metroNIDAZOLE (FLAGYL) 500 MG tablet Take 1 tablet (500 mg total) by mouth 2 (two) times daily. Patient not taking: Reported on 08/23/2017 08/01/17   Pauline Aus, PA-C    Family History Family History  Problem Relation Age of Onset  . Cancer Maternal Grandmother   . Diabetes Maternal Grandmother   . Hypertension Maternal Grandmother   . Alcoholism Maternal Grandfather   . Cirrhosis Maternal Grandfather   . Diabetes Mother   . Hypertension Mother   . Cleft lip Son   . Cleft palate Son     Social History Social History   Tobacco Use  . Smoking status: Current Every Day Smoker    Packs/day: 0.50    Years: 22.00    Pack years: 11.00    Types: Cigarettes  . Smokeless tobacco: Never Used  Substance Use Topics  . Alcohol use: Yes    Alcohol/week: 0.0 oz    Comment: denies use 08/01/17  . Drug use: Yes    Types: Marijuana    Comment:  denies use 08/01/17     Allergies  Patient has no known allergies.   Review of Systems Review of Systems  Constitutional: Negative for fever.  HENT: Negative for congestion.   Eyes: Negative for redness.  Respiratory: Negative for shortness of breath.   Cardiovascular: Negative for chest pain.  Gastrointestinal: Positive for abdominal pain.  Genitourinary: Negative for dysuria and vaginal bleeding.  Musculoskeletal: Negative for back pain.  Skin: Negative for rash.  Neurological: Negative for headaches.  Hematological: Does not bruise/bleed easily.  Psychiatric/Behavioral: Negative for confusion.     Physical Exam Updated Vital Signs BP 138/70 (BP Location: Right Arm)   Pulse 84   Temp 98.5 F (36.9 C) (Oral)   Resp 18   Ht 1.651 m (5\' 5" )   Wt 76.2 kg (168 lb)   LMP 06/09/2017   SpO2 100%    BMI 27.96 kg/m   Physical Exam  Constitutional: She is oriented to person, place, and time. She appears well-developed and well-nourished. No distress.  HENT:  Head: Normocephalic and atraumatic.  Mouth/Throat: Oropharynx is clear and moist.  Eyes: Conjunctivae and EOM are normal. Pupils are equal, round, and reactive to light.  Neck: Normal range of motion. Neck supple.  Cardiovascular: Normal rate.  Pulmonary/Chest: Effort normal and breath sounds normal.  Abdominal: Soft. Bowel sounds are normal. There is no tenderness.  Neurological: She is alert and oriented to person, place, and time. No cranial nerve deficit or sensory deficit. She exhibits normal muscle tone. Coordination normal.  Skin: No pallor.  Nursing note and vitals reviewed.    ED Treatments / Results  Labs (all labs ordered are listed, but only abnormal results are displayed) Labs Reviewed  COMPREHENSIVE METABOLIC PANEL - Abnormal; Notable for the following components:      Result Value   CO2 21 (*)    Glucose, Bld 115 (*)    Total Protein 8.4 (*)    ALT 12 (*)    Total Bilirubin 0.1 (*)    All other components within normal limits  CBC - Abnormal; Notable for the following components:   RBC 3.57 (*)    Hemoglobin 11.6 (*)    HCT 35.0 (*)    All other components within normal limits  URINALYSIS, ROUTINE W REFLEX MICROSCOPIC - Abnormal; Notable for the following components:   APPearance CLOUDY (*)    Protein, ur 30 (*)    Leukocytes, UA TRACE (*)    Bacteria, UA RARE (*)    Squamous Epithelial / LPF TOO NUMEROUS TO COUNT (*)    All other components within normal limits  HCG, QUANTITATIVE, PREGNANCY - Abnormal; Notable for the following components:   hCG, Beta Chain, Quant, [place] 16,109 (*)    All other components within normal limits  URINE CULTURE  LIPASE, BLOOD    EKG  EKG Interpretation None       Radiology No results found.  Procedures Procedures (including critical care  time)  Medications Ordered in ED Medications - No data to display   Initial Impression / Assessment and Plan / ED Course  I have reviewed the triage vital signs and the nursing notes.  Pertinent labs & imaging results that were available during my care of the patient were reviewed by me and considered in my medical decision making (see chart for details).     Patient [redacted] weeks pregnant.  Patient's had ultrasound done at [facility] showing intrauterine pregnancy with positive fetal heart tones.  Fetal heart tones here tonight are 150.  Based on her lower abdominal  crampy pain technically a threatened miscarriage.  No vaginal bleeding.  Abdomen soft and nontender.  Patient has follow-up with OB/GYN on Wednesday.  Quantitative hCG was done today as a baseline.  Will be out of compare that on her visit.  Patient nontoxic no acute distress.  Urinalysis not consistent with urinary tract infection.  Culture sent.  Final Clinical Impressions(s) / ED Diagnoses   Final diagnoses:  Threatened miscarriage in early pregnancy    ED Discharge Orders    None       Vanetta MuldersZackowski, Medardo Hassing, MD 08/24/17 [phone]

## 2017-08-25 LAB — URINE CULTURE

## 2017-08-26 ENCOUNTER — Ambulatory Visit (INDEPENDENT_AMBULATORY_CARE_PROVIDER_SITE_OTHER): Payer: Medicaid Other | Admitting: Women's Health

## 2017-08-26 ENCOUNTER — Encounter: Payer: Self-pay | Admitting: Women's Health

## 2017-08-26 ENCOUNTER — Ambulatory Visit: Payer: Medicaid Other | Admitting: *Deleted

## 2017-08-26 VITALS — BP 128/80 | HR 66 | Wt 165.0 lb

## 2017-08-26 DIAGNOSIS — R102 Pelvic and perineal pain: Secondary | ICD-10-CM

## 2017-08-26 DIAGNOSIS — O09521 Supervision of elderly multigravida, first trimester: Secondary | ICD-10-CM

## 2017-08-26 DIAGNOSIS — Z3481 Encounter for supervision of other normal pregnancy, first trimester: Secondary | ICD-10-CM

## 2017-08-26 DIAGNOSIS — O0990 Supervision of high risk pregnancy, unspecified, unspecified trimester: Secondary | ICD-10-CM

## 2017-08-26 DIAGNOSIS — Z331 Pregnant state, incidental: Secondary | ICD-10-CM

## 2017-08-26 DIAGNOSIS — Z3A11 11 weeks gestation of pregnancy: Secondary | ICD-10-CM

## 2017-08-26 DIAGNOSIS — F17200 Nicotine dependence, unspecified, uncomplicated: Secondary | ICD-10-CM

## 2017-08-26 DIAGNOSIS — O09291 Supervision of pregnancy with other poor reproductive or obstetric history, first trimester: Secondary | ICD-10-CM

## 2017-08-26 DIAGNOSIS — Z8279 Family history of other congenital malformations, deformations and chromosomal abnormalities: Secondary | ICD-10-CM

## 2017-08-26 DIAGNOSIS — Z3682 Encounter for antenatal screening for nuchal translucency: Secondary | ICD-10-CM

## 2017-08-26 DIAGNOSIS — O10919 Unspecified pre-existing hypertension complicating pregnancy, unspecified trimester: Secondary | ICD-10-CM

## 2017-08-26 DIAGNOSIS — O9989 Other specified diseases and conditions complicating pregnancy, childbirth and the puerperium: Secondary | ICD-10-CM

## 2017-08-26 DIAGNOSIS — Z1389 Encounter for screening for other disorder: Secondary | ICD-10-CM

## 2017-08-26 DIAGNOSIS — O0991 Supervision of high risk pregnancy, unspecified, first trimester: Secondary | ICD-10-CM

## 2017-08-26 DIAGNOSIS — O09529 Supervision of elderly multigravida, unspecified trimester: Secondary | ICD-10-CM | POA: Insufficient documentation

## 2017-08-26 DIAGNOSIS — O99321 Drug use complicating pregnancy, first trimester: Secondary | ICD-10-CM

## 2017-08-26 LAB — POCT URINALYSIS DIPSTICK
Blood, UA: NEGATIVE
Glucose, UA: NEGATIVE
Ketones, UA: NEGATIVE
Leukocytes, UA: NEGATIVE
Nitrite, UA: NEGATIVE
Protein, UA: NEGATIVE

## 2017-08-26 NOTE — Patient Instructions (Signed)
[name], I greatly value your feedback.  If you receive a survey following your visit with us today, we appreciate you taking the time to fill it out.  Thanks, [name], CNM, WHNP-BC  Begin taking a 81mg  baby aspirin daily at 12 weeks of pregnancy (1/15) to decrease risk of preeclampsia during pregnancy    Nausea & Vomiting  Have saltine crackers or pretzels by your bed and eat a few bites before you raise your head out of bed in the morning  Eat small frequent meals throughout the day instead of large meals  Drink plenty of fluids throughout the day to stay hydrated, just don't drink a lot of fluids with your meals.  This can make your stomach fill up faster making you feel sick  Do not brush your teeth right after you eat  Products with real ginger are good for nausea, like ginger ale and ginger hard candy Make sure it says made with real ginger!  Sucking on sour candy like lemon heads is also good for nausea  If your prenatal vitamins make you nauseated, take them at night so you will sleep through the nausea  Sea Bands  If you feel like you need medicine for the nausea & vomiting please let us know  If you are unable to keep any fluids or food down please let us know   Constipation  Drink plenty of fluid, preferably water, throughout the day  Eat foods high in fiber such as fruits, vegetables, and grains  Exercise, such as walking, is a good way to keep your bowels regular  Drink warm fluids, especially warm prune juice, or decaf coffee  Eat a 1/2 cup of real oatmeal (not instant), 1/2 cup applesauce, and 1/2-1 cup warm prune juice every day  If needed, you may take Colace (docusate sodium) stool softener once or twice a day to help keep the stool soft. If you are pregnant, wait until you are out of your first trimester (12-14 weeks of pregnancy)  If you still are having problems with constipation, you may take Miralax once daily as needed to help keep your  bowels regular.  If you are pregnant, wait until you are out of your first trimester (12-14 weeks of pregnancy)   First Trimester of Pregnancy The first trimester of pregnancy is from week 1 until the end of week 12 (months 1 through 3). A week after a sperm fertilizes an egg, the egg will implant on the wall of the uterus. This embryo will begin to develop into a baby. Genes from you and your partner are forming the baby. The female genes determine whether the baby is a boy or a girl. At 6-8 weeks, the eyes and face are formed, and the heartbeat can be seen on ultrasound. At the end of 12 weeks, all the baby's organs are formed.  Now that you are pregnant, you will want to do everything you can to have a healthy baby. Two of the most important things are to get good prenatal care and to follow your health care provider's instructions. Prenatal care is all the medical care you receive before the baby's birth. This care will help prevent, find, and treat any problems during the pregnancy and childbirth. BODY CHANGES Your body goes through many changes during pregnancy. The changes vary from woman to woman.   You may gain or lose a couple of pounds at first.  You may feel sick to your stomach (nauseous) and throw up (  vomit). If the vomiting is uncontrollable, call your health care provider.  You may tire easily.  You may develop headaches that can be relieved by medicines approved by your health care provider.  You may urinate more often. Painful urination may mean you have a bladder infection.  You may develop heartburn as a result of your pregnancy.  You may develop constipation because certain hormones are causing the muscles that push waste through your intestines to slow down.  You may develop hemorrhoids or swollen, bulging veins (varicose veins).  Your breasts may begin to grow larger and become tender. Your nipples may stick out more, and the tissue that surrounds them (areola) may become  darker.  Your gums may bleed and may be sensitive to brushing and flossing.  Dark spots or blotches (chloasma, mask of pregnancy) may develop on your face. This will likely fade after the baby is born.  Your menstrual periods will stop.  You may have a loss of appetite.  You may develop cravings for certain kinds of food.  You may have changes in your emotions from day to day, such as being excited to be pregnant or being concerned that something may go wrong with the pregnancy and baby.  You may have more vivid and strange dreams.  You may have changes in your hair. These can include thickening of your hair, rapid growth, and changes in texture. Some women also have hair loss during or after pregnancy, or hair that feels dry or thin. Your hair will most likely return to normal after your baby is born. WHAT TO EXPECT AT YOUR PRENATAL VISITS During a routine prenatal visit:  You will be weighed to make sure you and the baby are growing normally.  Your blood pressure will be taken.  Your abdomen will be measured to track your baby's growth.  The fetal heartbeat will be listened to starting around week 10 or 12 of your pregnancy.  Test results from any previous visits will be discussed. Your health care provider may ask you:  How you are feeling.  If you are feeling the baby move.  If you have had any abnormal symptoms, such as leaking fluid, bleeding, severe headaches, or abdominal cramping.  If you have any questions. Other tests that may be performed during your first trimester include:  Blood tests to find your blood type and to check for the presence of any previous infections. They will also be used to check for low iron levels (anemia) and Rh antibodies. Later in the pregnancy, blood tests for diabetes will be done along with other tests if problems develop.  Urine tests to check for infections, diabetes, or protein in the urine.  An ultrasound to confirm the proper  growth and development of the baby.  An amniocentesis to check for possible genetic problems.  Fetal screens for spina bifida and Down syndrome.  You may need other tests to make sure you and the baby are doing well. HOME CARE INSTRUCTIONS  Medicines  Follow your health care provider's instructions regarding medicine use. Specific medicines may be either safe or unsafe to take during pregnancy.  Take your prenatal vitamins as directed.  If you develop constipation, try taking a stool softener if your health care provider approves. Diet  Eat regular, well-balanced meals. Choose a variety of foods, such as meat or vegetable-based protein, fish, milk and low-fat dairy products, vegetables, fruits, and whole grain breads and cereals. Your health care provider will help you determine the  amount of weight gain that is right for you.  Avoid raw meat and uncooked cheese. These carry germs that can cause birth defects in the baby.  Eating four or five small meals rather than three large meals a day may help relieve nausea and vomiting. If you start to feel nauseous, eating a few soda crackers can be helpful. Drinking liquids between meals instead of during meals also seems to help nausea and vomiting.  If you develop constipation, eat more high-fiber foods, such as fresh vegetables or fruit and whole grains. Drink enough fluids to keep your urine clear or pale yellow. Activity and Exercise  Exercise only as directed by your health care provider. Exercising will help you:  Control your weight.  Stay in shape.  Be prepared for labor and delivery.  Experiencing pain or cramping in the lower abdomen or low back is a good sign that you should stop exercising. Check with your health care provider before continuing normal exercises.  Try to avoid standing for long periods of time. Move your legs often if you must stand in one place for a long time.  Avoid heavy lifting.  Wear low-heeled  shoes, and practice good posture.  You may continue to have sex unless your health care provider directs you otherwise. Relief of Pain or Discomfort  Wear a good support bra for breast tenderness.   Take warm sitz baths to soothe any pain or discomfort caused by hemorrhoids. Use hemorrhoid cream if your health care provider approves.   Rest with your legs elevated if you have leg cramps or low back pain.  If you develop varicose veins in your legs, wear support hose. Elevate your feet for 15 minutes, 3-4 times a day. Limit salt in your diet. Prenatal Care  Schedule your prenatal visits by the twelfth week of pregnancy. They are usually scheduled monthly at first, then more often in the last 2 months before delivery.  Write down your questions. Take them to your prenatal visits.  Keep all your prenatal visits as directed by your health care provider. Safety  Wear your seat belt at all times when driving.  Make a list of emergency phone numbers, including numbers for family, friends, the hospital, and police and fire departments. General Tips  Ask your health care provider for a referral to a local prenatal education class. Begin classes no later than at the beginning of month 6 of your pregnancy.  Ask for help if you have counseling or nutritional needs during pregnancy. Your health care provider can offer advice or refer you to specialists for help with various needs.  Do not use hot tubs, steam rooms, or saunas.  Do not douche or use tampons or scented sanitary pads.  Do not cross your legs for long periods of time.  Avoid cat litter boxes and soil used by cats. These carry germs that can cause birth defects in the baby and possibly loss of the fetus by miscarriage or stillbirth.  Avoid all smoking, herbs, alcohol, and medicines not prescribed by your health care provider. Chemicals in these affect the formation and growth of the baby.  Schedule a dentist appointment. At  home, brush your teeth with a soft toothbrush and be gentle when you floss. SEEK MEDICAL CARE IF:   You have dizziness.  You have mild pelvic cramps, pelvic pressure, or nagging pain in the abdominal area.  You have persistent nausea, vomiting, or diarrhea.  You have a bad smelling vaginal discharge.  You  have pain with urination.  You notice increased swelling in your face, hands, legs, or ankles. SEEK IMMEDIATE MEDICAL CARE IF:   You have a fever.  You are leaking fluid from your vagina.  You have spotting or bleeding from your vagina.  You have severe abdominal cramping or pain.  You have rapid weight gain or loss.  You vomit blood or material that looks like coffee grounds.  You are exposed to [place] measles and have never had them.  You are exposed to fifth disease or chickenpox.  You develop a severe headache.  You have shortness of breath.  You have any kind of trauma, such as from a fall or a car accident. Document Released: 07/29/2001 Document Revised: 12/19/2013 Document Reviewed: 06/14/2013 [facility] Patient Information 2015 [employer] [place], [place]. This information is not intended to replace advice given to you by your health care provider. Make sure you discuss any questions you have with your health care provider.

## 2017-08-26 NOTE — Progress Notes (Addendum)
INITIAL OBSTETRICAL VISIT Patient name: [name] MRN [id number]  Date of birth: 09/18/1975 Chief Complaint:   Initial Prenatal Visit (lower abdominal pain)  History of Present Illness:   [name] is a 42 y.o. [id number] African American female at 3817w1d by LMP c/w 8wk u/s, with an Estimated Date of Delivery: 03/16/18 being seen today for her initial obstetrical visit.   Her obstetrical history is significant for term svb x 3, 1st baby w/ cleft lip/palate, sab x 1. H/O rapid labors.   Today she reports went to ED w/ abd pain, states she was told could possibly be early stages of miscarriage. No bleeding. Describes pain as intermittent changing from Lt to Rt side, pulling. Smoker: 1ppd now down to 1 pack/3days and trying to quit- declines QuitlineNC. [facility] was rx'd Norvasc in last Feb at ED for HTN, however only took x 1mth then stopped. Denies h/o CHTN prior to his or HTN during pregnancies. Review of chart reveals multiple elevated bp's since 2017.   Patient's last menstrual period was 06/09/2017. Last pap 02/01/16. Results were: LSIL/HPV Review of Systems:   Pertinent items are noted in HPI Denies cramping/contractions, leakage of fluid, vaginal bleeding, abnormal vaginal discharge w/ itching/odor/irritation, headaches, visual changes, shortness of breath, chest pain, abdominal pain, severe nausea/vomiting, or problems with urination or bowel movements unless otherwise stated above.  Pertinent History Reviewed:  Reviewed past medical,surgical, social, obstetrical and family history.  Reviewed problem list, medications and allergies. OB History  Gravida Para Term Preterm AB Living  5 3 3   1 3   SAB TAB Ectopic Multiple Live Births  1       3    # Outcome Date GA Lbr Len/2nd Weight Sex Delivery Anes PTL Lv  5 Current           4 Term 01/26/07 9165w0d  5 lb 11 oz (2.58 kg) F Vag-Spont None N LIV  3 Term 02/27/00 5665w0d  6 lb 12 oz (3.062 kg) M Vag-Spont None N LIV  2 SAB 10/1998           1 Term 08/13/94 4774w0d  7 lb 14 oz (3.572 kg) M Vag-Vacuum None N LIV     Birth Comments: cleft lip/palate     Physical Assessment:   Vitals:   08/26/17 1025  BP: 128/80  Pulse: 66  Weight: 165 lb (74.8 kg)  Body mass index is 27.46 kg/m.       Physical Examination:  General appearance - well appearing, and in no distress  Mental status - alert, oriented to person, place, and time  Psych:  She has a normal mood and affect  Skin - warm and dry, normal color, no suspicious lesions noted  Chest - effort normal, all lung fields clear to auscultation bilaterally  Heart - normal rate and regular rhythm  Abdomen - soft, nontender  Extremities:  No swelling or varicosities noted  Pelvic - VULVA: normal appearing vulva with no masses, tenderness or lesions  VAGINA: normal appearing vagina with normal color and discharge, no lesions  CERVIX: normal appearing cervix without discharge or lesions, no CMT  Thin prep pap is not done> pt declines today-wants to do next visit  Fetal Heart Rate (bpm): +u/s via informal transabdominal u/s, +active fetus  Results for orders placed or performed in visit on 08/26/17 (from the past 24 hour(s))  POCT urinalysis dipstick   Collection Time: 08/26/17 11:00 AM  Result Value Ref Range   Color, UA  Clarity, UA     Glucose, UA neg    Bilirubin, UA     Ketones, UA neg    Spec Grav, UA  1.010 - 1.025   Blood, UA neg    pH, UA  5.0 - 8.0   Protein, UA neg    Urobilinogen, UA  0.2 or 1.0 E.U./dL   Nitrite, UA neg    Leukocytes, UA Negative Negative   Appearance     Odor      Assessment & Plan:  1) High-Risk Pregnancy [id number] at [redacted]w[redacted]d with an Estimated Date of Delivery: 03/16/18   2) Initial OB visit  3) AMA, turns 42yo 3/27  4) H/O CHTN, no meds, will monitor, start baby ASA @ 12wks, will get baseline pre-e labs  5) H/O child w/ cleft lip/palate  6) Round ligament pain  7) H/O abnormal pap> w/o adequate f/u, needs pap next visit,  didn't want to do today  8) Smoker> Smokes 1pack/3days, counseled x 3-75mins, advised cessation, discussed risks to fetus while pregnant, to infant pp, and to herself. Offered QuitlineNC, declined  Initial labs obtained Continue prenatal vitamins Reviewed n/v relief measures and warning s/s to report Reviewed recommended weight gain based on pre-gravid BMI Encouraged well-balanced diet Genetic Screening discussed Integrated Screen: requested Cystic fibrosis screening discussed requested Ultrasound discussed; fetal survey: requested CCNC completed> spoke w/ Jasmine  Follow-up: Return in about 2 weeks (around 09/09/2017) for HROB w/ pap, US:NT+1stIT.   Orders Placed This Encounter  Procedures  . Urine Culture  . GC/Chlamydia Probe Amp  . US Fetal Nuchal Translucency Measurement  . Obstetric Panel, Including HIV  . Urinalysis, Routine w reflex microscopic  . Sickle cell screen  . Pain Management Screening Profile (10S)  . Cystic Fibrosis Mutation 97  . Comprehensive metabolic panel  . Protein, urine, 24 hour  . POCT urinalysis dipstick    Marge Duncans CNM, [facility] 08/26/2017 11:37 AM

## 2017-08-27 LAB — COMPREHENSIVE METABOLIC PANEL
A/G RATIO: 1.3 (ref 1.2–2.2)
ALT: 12 IU/L (ref 0–32)
AST: 13 IU/L (ref 0–40)
Albumin: 4.5 g/dL (ref 3.5–5.5)
Alkaline Phosphatase: 60 IU/L (ref 39–117)
BILIRUBIN TOTAL: 0.3 mg/dL (ref 0.0–1.2)
BUN / CREAT RATIO: 16 (ref 9–23)
BUN: 9 mg/dL (ref 6–20)
CHLORIDE: 102 mmol/L (ref 96–106)
CO2: 18 mmol/L — ABNORMAL LOW (ref 20–29)
Calcium: 10.8 mg/dL — ABNORMAL HIGH (ref 8.7–10.2)
Creatinine, Ser: 0.57 mg/dL (ref 0.57–1.00)
GFR calc non Af Amer: 117 mL/min/{1.73_m2} (ref >59)
GFR, EST AFRICAN AMERICAN: 135 mL/min/{1.73_m2} (ref >59)
Globulin, Total: 3.4 g/dL (ref 1.5–4.5)
Glucose: 85 mg/dL (ref 65–99)
POTASSIUM: 4.8 mmol/L (ref 3.5–5.2)
Sodium: 137 mmol/L (ref 134–144)
TOTAL PROTEIN: 7.9 g/dL (ref 6.0–8.5)

## 2017-08-28 ENCOUNTER — Other Ambulatory Visit: Payer: Self-pay | Admitting: Women's Health

## 2017-08-28 LAB — OBSTETRIC PANEL, INCLUDING HIV
Antibody Screen: NEGATIVE
BASOS ABS: 0 10*3/uL (ref 0.0–0.2)
Basos: 0 %
EOS (ABSOLUTE): 0.1 10*3/uL (ref 0.0–0.4)
Eos: 1 %
HEMOGLOBIN: 11.7 g/dL (ref 11.1–15.9)
HIV Screen 4th Generation wRfx: NONREACTIVE
Hematocrit: 33.7 % — ABNORMAL LOW (ref 34.0–46.6)
Hepatitis B Surface Ag: NEGATIVE
IMMATURE GRANS (ABS): 0 10*3/uL (ref 0.0–0.1)
IMMATURE GRANULOCYTES: 0 %
LYMPHS ABS: 3 10*3/uL (ref 0.7–3.1)
LYMPHS: 35 %
MCH: 33.6 pg — AB (ref 26.6–33.0)
MCHC: 34.7 g/dL (ref 31.5–35.7)
MCV: 97 fL (ref 79–97)
MONOS ABS: 0.4 10*3/uL (ref 0.1–0.9)
Monocytes: 5 %
NEUTROS PCT: 59 %
Neutrophils Absolute: 5 10*3/uL (ref 1.4–7.0)
PLATELETS: 386 10*3/uL — AB (ref 150–379)
RBC: 3.48 x10E6/uL — ABNORMAL LOW (ref 3.77–5.28)
RDW: 12 % — ABNORMAL LOW (ref 12.3–15.4)
RPR Ser Ql: NONREACTIVE
Rh Factor: NEGATIVE
Rubella Antibodies, IGG: 1 index (ref >0.99)
WBC: 8.5 10*3/uL (ref 3.4–10.8)

## 2017-08-28 LAB — URINALYSIS, ROUTINE W REFLEX MICROSCOPIC
BILIRUBIN UA: NEGATIVE
Glucose, UA: NEGATIVE
Leukocytes, UA: NEGATIVE
NITRITE UA: NEGATIVE
RBC UA: NEGATIVE
SPEC GRAV UA: 1.027 (ref 1.005–1.030)
UUROB: 1 mg/dL (ref 0.2–1.0)
pH, UA: 6 (ref 5.0–7.5)

## 2017-08-28 LAB — SICKLE CELL SCREEN: SICKLE CELL SCREEN: NEGATIVE

## 2017-08-29 LAB — PROTEIN, URINE, 24 HOUR
PROTEIN 24H UR: 104 mg/(24.h) (ref 30–150)
Protein, Ur: 11.6 mg/dL

## 2017-09-01 LAB — CYSTIC FIBROSIS MUTATION 97: Interpretation: NOT DETECTED

## 2017-09-09 ENCOUNTER — Other Ambulatory Visit: Payer: Self-pay

## 2017-09-09 ENCOUNTER — Ambulatory Visit (INDEPENDENT_AMBULATORY_CARE_PROVIDER_SITE_OTHER): Payer: Medicaid Other | Admitting: Women's Health

## 2017-09-09 ENCOUNTER — Encounter: Payer: Self-pay | Admitting: Women's Health

## 2017-09-09 ENCOUNTER — Ambulatory Visit (INDEPENDENT_AMBULATORY_CARE_PROVIDER_SITE_OTHER): Payer: Medicaid Other

## 2017-09-09 ENCOUNTER — Other Ambulatory Visit (HOSPITAL_COMMUNITY)
Admission: RE | Admit: 2017-09-09 | Discharge: 2017-09-09 | Disposition: A | Discharge status: Home / Self Care | Payer: Medicaid Other | Source: Ambulatory Visit | Attending: Obstetrics & Gynecology | Admitting: Obstetrics & Gynecology

## 2017-09-09 VITALS — BP 114/60 | HR 77 | Wt 167.0 lb

## 2017-09-09 DIAGNOSIS — Z1389 Encounter for screening for other disorder: Secondary | ICD-10-CM

## 2017-09-09 DIAGNOSIS — Z3A13 13 weeks gestation of pregnancy: Secondary | ICD-10-CM

## 2017-09-09 DIAGNOSIS — F17210 Nicotine dependence, cigarettes, uncomplicated: Secondary | ICD-10-CM

## 2017-09-09 DIAGNOSIS — Z331 Pregnant state, incidental: Secondary | ICD-10-CM

## 2017-09-09 DIAGNOSIS — Z8279 Family history of other congenital malformations, deformations and chromosomal abnormalities: Secondary | ICD-10-CM

## 2017-09-09 DIAGNOSIS — O09521 Supervision of elderly multigravida, first trimester: Secondary | ICD-10-CM

## 2017-09-09 DIAGNOSIS — O10911 Unspecified pre-existing hypertension complicating pregnancy, first trimester: Secondary | ICD-10-CM | POA: Diagnosis not present

## 2017-09-09 DIAGNOSIS — Z124 Encounter for screening for malignant neoplasm of cervix: Secondary | ICD-10-CM | POA: Diagnosis present

## 2017-09-09 DIAGNOSIS — O10919 Unspecified pre-existing hypertension complicating pregnancy, unspecified trimester: Secondary | ICD-10-CM

## 2017-09-09 DIAGNOSIS — O0991 Supervision of high risk pregnancy, unspecified, first trimester: Secondary | ICD-10-CM

## 2017-09-09 DIAGNOSIS — Z3481 Encounter for supervision of other normal pregnancy, first trimester: Secondary | ICD-10-CM

## 2017-09-09 DIAGNOSIS — O99331 Smoking (tobacco) complicating pregnancy, first trimester: Secondary | ICD-10-CM

## 2017-09-09 DIAGNOSIS — Z3682 Encounter for antenatal screening for nuchal translucency: Secondary | ICD-10-CM

## 2017-09-09 DIAGNOSIS — O0990 Supervision of high risk pregnancy, unspecified, unspecified trimester: Secondary | ICD-10-CM

## 2017-09-09 LAB — POCT URINALYSIS DIPSTICK
Glucose, UA: NEGATIVE
KETONES UA: NEGATIVE
Leukocytes, UA: NEGATIVE
NITRITE UA: NEGATIVE
PROTEIN UA: NEGATIVE
RBC UA: NEGATIVE

## 2017-09-09 NOTE — Progress Notes (Signed)
US 13+1 wks,measurements c/w dates,NB present,NT 1.6 mm,normal right ovary,left ovary not visualized, crl 78.45 mm,fhr 136 bpm,posterior pl gr 0

## 2017-09-09 NOTE — Progress Notes (Signed)
   HIGH-RISK PREGNANCY VISIT Patient name: [name] MRN [id number]  Date of birth: 03-18-1976 Chief Complaint:   High Risk Gestation (u/s today)  History of Present Illness:   [name] is a 42 y.o. [phone] female at 8087w1d with an Estimated Date of Delivery: 03/16/18 being seen today for ongoing management of a high-risk pregnancy complicated by AMA, CHTN- no meds.  Today she reports no complaints.  . Vag. Bleeding: None.  Movement: Absent. denies leaking of fluid.  Review of Systems:   Pertinent items are noted in HPI Denies abnormal vaginal discharge w/ itching/odor/irritation, headaches, visual changes, shortness of breath, chest pain, abdominal pain, severe nausea/vomiting, or problems with urination or bowel movements unless otherwise stated above. Pertinent History Reviewed:  Reviewed past medical,surgical, social, obstetrical and family history.  Reviewed problem list, medications and allergies. Physical Assessment:   Vitals:   09/09/17 1042  BP: 114/60  Pulse: 77  Weight: 167 lb (75.8 kg)  Body mass index is 27.79 kg/m.           Physical Examination:   General appearance: alert, well appearing, and in no distress  Mental status: alert, oriented to person, place, and time  Skin: warm & dry   Extremities: Edema: None    Cardiovascular: normal heart rate noted  Respiratory: normal respiratory effort, no distress  Abdomen: gravid, soft, non-tender  Pelvic: thin prep pap obtained         Fetal Status: Fetal Heart Rate (bpm): 136 u/s   Movement: Absent    Fetal Surveillance Testing today: US 13+1 wks,measurements c/w dates,NB present,NT 1.6 mm,normal right ovary,left ovary not visualized, crl 78.45 mm,fhr 136 bpm,posterior pl gr 0  Results for orders placed or performed in visit on 09/09/17 (from the past 24 hour(s))  POCT urinalysis dipstick   Collection Time: 09/09/17 10:44 AM  Result Value Ref Range   Color, UA     Clarity, UA     Glucose, UA neg    Bilirubin, UA     Ketones, UA neg    Spec Grav, UA  1.010 - 1.025   Blood, UA neg    pH, UA  5.0 - 8.0   Protein, UA neg    Urobilinogen, UA  0.2 or 1.0 E.U./dL   Nitrite, UA neg    Leukocytes, UA Negative Negative   Appearance     Odor      Assessment & Plan:  1) High-risk pregnancy [id number] at 7587w1d with an Estimated Date of Delivery: 03/16/18   2) AMA, stable, turns 42yo in March  3) CHTN-no meds, stable, continue baby asa daily  Meds: No orders of the defined types were placed in this encounter.   Labs/procedures today: 2nd IT, pap  Treatment Plan:  U/S @ 20, 24, 28, 32, 36wks    2x/wk testing nst/sono @ 36wks    Deliver @ 40wks  Reviewed: Preterm labor symptoms and general obstetric precautions including but not limited to vaginal bleeding, contractions, leaking of fluid and fetal movement were reviewed in detail with the patient.  All questions were answered.  Follow-up: Return in about 3 weeks (around 09/30/2017) for HROB, 2nd IT.  Orders Placed This Encounter  Procedures  . Integrated 1  . POCT urinalysis dipstick   Marge DuncansBooker, Karlo Goeden Randall CNM, [facility] IncWHNP-BC 09/09/2017 11:11 AM

## 2017-09-09 NOTE — Patient Instructions (Signed)
[name], I greatly value your feedback.  If you receive a survey following your visit with us today, we appreciate you taking the time to fill it out.  Thanks, [name], CNM, WHNP-BC   Second Trimester of Pregnancy The second trimester is from week 14 through week 27 (months 4 through 6). The second trimester is often a time when you feel your best. Your body has adjusted to being pregnant, and you begin to feel better physically. Usually, morning sickness has lessened or quit completely, you may have more energy, and you may have an increase in appetite. The second trimester is also a time when the fetus is growing rapidly. At the end of the sixth month, the fetus is about 9 inches long and weighs about 1 pounds. You will likely begin to feel the baby move (quickening) between 16 and 20 weeks of pregnancy. Body changes during your second trimester Your body continues to go through many changes during your second trimester. The changes vary from woman to woman.  Your weight will continue to increase. You will notice your lower abdomen bulging out.  You may begin to get stretch marks on your hips, abdomen, and breasts.  You may develop headaches that can be relieved by medicines. The medicines should be approved by your health care provider.  You may urinate more often because the fetus is pressing on your bladder.  You may develop or continue to have heartburn as a result of your pregnancy.  You may develop constipation because certain hormones are causing the muscles that push waste through your intestines to slow down.  You may develop hemorrhoids or swollen, bulging veins (varicose veins).  You may have back pain. This is caused by: ? Weight gain. ? Pregnancy hormones that are relaxing the joints in your pelvis. ? A shift in weight and the muscles that support your balance.  Your breasts will continue to grow and they will continue to become tender.  Your gums may bleed and  may be sensitive to brushing and flossing.  Dark spots or blotches (chloasma, mask of pregnancy) may develop on your face. This will likely fade after the baby is born.  A dark line from your belly button to the pubic area (linea nigra) may appear. This will likely fade after the baby is born.  You may have changes in your hair. These can include thickening of your hair, rapid growth, and changes in texture. Some women also have hair loss during or after pregnancy, or hair that feels dry or thin. Your hair will most likely return to normal after your baby is born.  What to expect at prenatal visits During a routine prenatal visit:  You will be weighed to make sure you and the fetus are growing normally.  Your blood pressure will be taken.  Your abdomen will be measured to track your baby's growth.  The fetal heartbeat will be listened to.  Any test results from the previous visit will be discussed.  Your health care provider may ask you:  How you are feeling.  If you are feeling the baby move.  If you have had any abnormal symptoms, such as leaking fluid, bleeding, severe headaches, or abdominal cramping.  If you are using any tobacco products, including cigarettes, chewing tobacco, and electronic cigarettes.  If you have any questions.  Other tests that may be performed during your second trimester include:  Blood tests that check for: ? Low iron levels (anemia). ? High blood  sugar that affects pregnant women (gestational diabetes) between 55 and 28 weeks. ? Rh antibodies. This is to check for a protein on red blood cells (Rh factor).  Urine tests to check for infections, diabetes, or protein in the urine.  An ultrasound to confirm the proper growth and development of the baby.  An amniocentesis to check for possible genetic problems.  Fetal screens for spina bifida and Down syndrome.  HIV (human immunodeficiency virus) testing. Routine prenatal testing includes  screening for HIV, unless you choose not to have this test.  Follow these instructions at home: Medicines  Follow your health care provider's instructions regarding medicine use. Specific medicines may be either safe or unsafe to take during pregnancy.  Take a prenatal vitamin that contains at least 600 micrograms (mcg) of folic acid.  If you develop constipation, try taking a stool softener if your health care provider approves. Eating and drinking  Eat a balanced diet that includes fresh fruits and vegetables, whole grains, good sources of protein such as meat, eggs, or tofu, and low-fat dairy. Your health care provider will help you determine the amount of weight gain that is right for you.  Avoid raw meat and uncooked cheese. These carry germs that can cause birth defects in the baby.  If you have low calcium intake from food, talk to your health care provider about whether you should take a daily calcium supplement.  Limit foods that are high in fat and processed sugars, such as fried and sweet foods.  To prevent constipation: ? Drink enough fluid to keep your urine clear or pale yellow. ? Eat foods that are high in fiber, such as fresh fruits and vegetables, whole grains, and beans. Activity  Exercise only as directed by your health care provider. Most women can continue their usual exercise routine during pregnancy. Try to exercise for 30 minutes at least 5 days a week. Stop exercising if you experience uterine contractions.  Avoid heavy lifting, wear low heel shoes, and practice good posture.  A sexual relationship may be continued unless your health care provider directs you otherwise. Relieving pain and discomfort  Wear a good support bra to prevent discomfort from breast tenderness.  Take warm sitz baths to soothe any pain or discomfort caused by hemorrhoids. Use hemorrhoid cream if your health care provider approves.  Rest with your legs elevated if you have leg cramps  or low back pain.  If you develop varicose veins, wear support hose. Elevate your feet for 15 minutes, 3-4 times a day. Limit salt in your diet. Prenatal Care  Write down your questions. Take them to your prenatal visits.  Keep all your prenatal visits as told by your health care provider. This is important. Safety  Wear your seat belt at all times when driving.  Make a list of emergency phone numbers, including numbers for family, friends, the hospital, and police and fire departments. General instructions  Ask your health care provider for a referral to a local prenatal education class. Begin classes no later than the beginning of month 6 of your pregnancy.  Ask for help if you have counseling or nutritional needs during pregnancy. Your health care provider can offer advice or refer you to specialists for help with various needs.  Do not use hot tubs, steam rooms, or saunas.  Do not douche or use tampons or scented sanitary pads.  Do not cross your legs for long periods of time.  Avoid cat litter boxes and soil used  by cats. These carry germs that can cause birth defects in the baby and possibly loss of the fetus by miscarriage or stillbirth.  Avoid all smoking, herbs, alcohol, and unprescribed drugs. Chemicals in these products can affect the formation and growth of the baby.  Do not use any products that contain nicotine or tobacco, such as cigarettes and e-cigarettes. If you need help quitting, ask your health care provider.  Visit your dentist if you have not gone yet during your pregnancy. Use a soft toothbrush to brush your teeth and be gentle when you floss. Contact a health care provider if:  You have dizziness.  You have mild pelvic cramps, pelvic pressure, or nagging pain in the abdominal area.  You have persistent nausea, vomiting, or diarrhea.  You have a bad smelling vaginal discharge.  You have pain when you urinate. Get help right away if:  You have a  fever.  You are leaking fluid from your vagina.  You have spotting or bleeding from your vagina.  You have severe abdominal cramping or pain.  You have rapid weight gain or weight loss.  You have shortness of breath with chest pain.  You notice sudden or extreme swelling of your face, hands, ankles, feet, or legs.  You have not felt your baby move in over an hour.  You have severe headaches that do not go away when you take medicine.  You have vision changes. Summary  The second trimester is from week 14 through week 27 (months 4 through 6). It is also a time when the fetus is growing rapidly.  Your body goes through many changes during pregnancy. The changes vary from woman to woman.  Avoid all smoking, herbs, alcohol, and unprescribed drugs. These chemicals affect the formation and growth your baby.  Do not use any tobacco products, such as cigarettes, chewing tobacco, and e-cigarettes. If you need help quitting, ask your health care provider.  Contact your health care provider if you have any questions. Keep all prenatal visits as told by your health care provider. This is important. This information is not intended to replace advice given to you by your health care provider. Make sure you discuss any questions you have with your health care provider. Document Released: 07/29/2001 Document Revised: 01/10/2016 Document Reviewed: 10/05/2012 Elsevier Interactive Patient Education  2017 [employer].

## 2017-09-10 ENCOUNTER — Encounter: Payer: Self-pay | Admitting: Women's Health

## 2017-09-10 DIAGNOSIS — F1290 Cannabis use, unspecified, uncomplicated: Secondary | ICD-10-CM | POA: Insufficient documentation

## 2017-09-10 LAB — PMP SCREEN PROFILE (10S), URINE
Amphetamine Scrn, Ur: NEGATIVE ng/mL
BARBITURATE SCREEN URINE: NEGATIVE ng/mL
BENZODIAZEPINE SCREEN, URINE: NEGATIVE ng/mL
CANNABINOIDS UR QL SCN: POSITIVE ng/mL — AB
COCAINE(METAB.)SCREEN, URINE: NEGATIVE ng/mL
Creatinine(Crt), U: 184.2 mg/dL (ref 20.0–300.0)
METHADONE SCREEN, URINE: NEGATIVE ng/mL
OPIATE SCREEN URINE: NEGATIVE ng/mL
OXYCODONE+OXYMORPHONE UR QL SCN: NEGATIVE ng/mL
PROPOXYPHENE SCREEN URINE: NEGATIVE ng/mL
Ph of Urine: 5.9 (ref 4.5–8.9)
Phencyclidine Qn, Ur: NEGATIVE ng/mL

## 2017-09-10 LAB — CYTOLOGY - PAP
CHLAMYDIA, DNA PROBE: NEGATIVE
DIAGNOSIS: NEGATIVE
HPV: NOT DETECTED
Neisseria Gonorrhea: NEGATIVE

## 2017-09-11 LAB — INTEGRATED 1
Crown Rump Length: 78.5 mm
Gest. Age on Collection Date: 13.3 weeks
Maternal Age at EDD: 40.3 yr
NUCHAL TRANSLUCENCY (NT): 1.6 mm
Number of Fetuses: 1
PAPP-A VALUE: 788.9 ng/mL
Weight: 167 [lb_av]

## 2017-09-11 LAB — URINE CULTURE

## 2017-09-30 ENCOUNTER — Ambulatory Visit (INDEPENDENT_AMBULATORY_CARE_PROVIDER_SITE_OTHER): Payer: Medicaid Other | Admitting: Advanced Practice Midwife

## 2017-09-30 ENCOUNTER — Encounter: Payer: Self-pay | Admitting: Advanced Practice Midwife

## 2017-09-30 VITALS — BP 110/70 | HR 99 | Wt 166.0 lb

## 2017-09-30 DIAGNOSIS — O09522 Supervision of elderly multigravida, second trimester: Secondary | ICD-10-CM

## 2017-09-30 DIAGNOSIS — Z3A16 16 weeks gestation of pregnancy: Secondary | ICD-10-CM

## 2017-09-30 DIAGNOSIS — O10912 Unspecified pre-existing hypertension complicating pregnancy, second trimester: Secondary | ICD-10-CM

## 2017-09-30 DIAGNOSIS — O0992 Supervision of high risk pregnancy, unspecified, second trimester: Secondary | ICD-10-CM

## 2017-09-30 DIAGNOSIS — Z363 Encounter for antenatal screening for malformations: Secondary | ICD-10-CM

## 2017-09-30 DIAGNOSIS — Z1379 Encounter for other screening for genetic and chromosomal anomalies: Secondary | ICD-10-CM

## 2017-09-30 MED ORDER — ASPIRIN EC 81 MG PO TBEC: 81.0000 mg | DELAYED_RELEASE_TABLET | Freq: Every day | ORAL | 6 refills | Status: DC

## 2017-09-30 NOTE — Progress Notes (Signed)
HIGH-RISK PREGNANCY VISIT Patient name: [name] MRN [id number]  Date of birth: 05-04-1976 Chief Complaint:   High Risk Gestation (2 nd IT)  History of Present Illness:   [name] is a 42 y.o. [phone] female at 752w1d with an Estimated Date of Delivery: 03/16/18 being seen today for ongoing management of a high-risk pregnancy complicated by chronic HTN. No meds Today she reports no complaints.  .  .  Movement: Absent. denies leaking of fluid. Appetite is actually much better than prepregnancy, eats all the time.  Review of Systems:   Pertinent items are noted in HPI Denies abnormal vaginal discharge w/ itching/odor/irritation, headaches, visual changes, shortness of breath, chest pain, abdominal pain, severe nausea/vomiting, or problems with urination or bowel movements unless otherwise stated above.    Pertinent History Reviewed:  Medical & Surgical Hx:   Past Medical History:  Diagnosis Date  . Abnormal Pap smear of cervix 02/05/2016  . Encounter for Nexplanon removal 01/02/2016  . GERD (gastroesophageal reflux disease)   . Patient desires pregnancy 02/01/2016   Past Surgical History:  Procedure Laterality Date  . NO PAST SURGERIES     Family History  Problem Relation Age of Onset  . Cancer Maternal Grandmother   . Diabetes Maternal Grandmother   . Hypertension Maternal Grandmother   . Alcoholism Maternal Grandfather   . Cirrhosis Maternal Grandfather   . Diabetes Mother   . Hypertension Mother   . Cleft lip Son   . Cleft palate Son   . Hypertension Maternal Aunt   . Hypertension Maternal Uncle     Current Outpatient Medications:  .  aspirin EC 81 MG tablet, Take 1 tablet (81 mg total) by mouth daily., Disp: 60 tablet, Rfl: 6 .  acetaminophen (TYLENOL) 500 MG tablet, Take 1 tablet (500 mg total) by mouth every 6 (six) hours as needed. (Patient not taking: Reported on 09/30/2017), Disp: 30 tablet, Rfl: 0 .  Prenatal Vit-Fe Fumarate-FA (MULTIVITAMIN-PRENATAL) 27-0.8 MG  TABS tablet, Take 1 tablet by mouth daily at 12 noon., Disp: , Rfl:  Social History: Reviewed -  reports that she has been smoking cigarettes.  She has a 5.50 pack-year smoking history. she has never used smokeless tobacco.   Physical Assessment:   Vitals:   09/30/17 1030  BP: 110/70  Pulse: 99  Weight: 166 lb (75.3 kg)  Body mass index is 27.62 kg/m.           Physical Examination:   General appearance: alert, well appearing, and in no distress  Mental status: alert, oriented to person, place, and time  Skin: warm & dry   Extremities: Edema: None    Cardiovascular: normal heart rate noted  Respiratory: normal respiratory effort, no distress  Abdomen: gravid, soft, non-tender  Pelvic: Cervical exam deferred         Fetal Status: Fetal Heart Rate (bpm): 152   Movement: Absent    Fetal Surveillance Testing today: doppler  No results found for this or any previous visit (from the past 24 hour(s)).  Assessment & Plan:  1) High-risk pregnancy G5P3013 at 6852w1d with an Estimated Date of Delivery: 03/16/18   2) CHTN,no meds, stable  3) ,   Labs/procedures today: 2nd IT  Medications: ASA 162mg  qd  Growth u/s @ 20, 28, 34, 38wks     2x/wk testing nst/sono @ 32wks or weekly BPP   Deliver @ 40wks (no meds), 39wks (meds):______   Follow-up: Return in about 2 weeks (around 10/14/2017) for HROB, MV:HQIONGES:Anatomy.  Orders Placed This Encounter  Procedures  . US OB Comp + 14 Wk  . INTEGRATED 2   Jacklyn Shell CNM 09/30/2017 12:22 PM

## 2017-10-04 LAB — INTEGRATED 2
ADSF: 0.9
AFP MARKER: 35.1 ng/mL
AFP MOM: 1.05
CROWN RUMP LENGTH: 78.5 mm
DIA MOM: 1.04
DIA Value: 163.3 pg/mL
ESTRIOL UNCONJUGATED: 0.78 ng/mL
GEST. AGE ON COLLECTION DATE: 13.3 wk
GESTATIONAL AGE: 16.6 wk
Maternal Age at EDD: 40.3 yr
NUCHAL TRANSLUCENCY MOM: 0.83
Nuchal Translucency (NT): 1.6 mm
Number of Fetuses: 1
PAPP-A MoM: 0.69
PAPP-A VALUE: 788.9 ng/mL
TEST RESULTS: NEGATIVE
WEIGHT: 167 [lb_av]
Weight: 167 [lb_av]
hCG MoM: 0.37
hCG Value: 10.6 IU/mL

## 2017-10-14 ENCOUNTER — Encounter: Payer: Self-pay | Admitting: Advanced Practice Midwife

## 2017-10-14 ENCOUNTER — Ambulatory Visit (INDEPENDENT_AMBULATORY_CARE_PROVIDER_SITE_OTHER): Payer: Medicaid Other | Admitting: Advanced Practice Midwife

## 2017-10-14 ENCOUNTER — Ambulatory Visit (INDEPENDENT_AMBULATORY_CARE_PROVIDER_SITE_OTHER): Payer: Medicaid Other

## 2017-10-14 VITALS — BP 100/60 | HR 98 | Wt 169.0 lb

## 2017-10-14 DIAGNOSIS — O10912 Unspecified pre-existing hypertension complicating pregnancy, second trimester: Secondary | ICD-10-CM

## 2017-10-14 DIAGNOSIS — Z363 Encounter for antenatal screening for malformations: Secondary | ICD-10-CM

## 2017-10-14 DIAGNOSIS — Z3A18 18 weeks gestation of pregnancy: Secondary | ICD-10-CM

## 2017-10-14 DIAGNOSIS — O0992 Supervision of high risk pregnancy, unspecified, second trimester: Secondary | ICD-10-CM

## 2017-10-14 DIAGNOSIS — O10919 Unspecified pre-existing hypertension complicating pregnancy, unspecified trimester: Secondary | ICD-10-CM

## 2017-10-14 DIAGNOSIS — O0990 Supervision of high risk pregnancy, unspecified, unspecified trimester: Secondary | ICD-10-CM

## 2017-10-14 NOTE — Patient Instructions (Signed)
[name], I greatly value your feedback.  If you receive a survey following your visit with us today, we appreciate you taking the time to fill it out.  Thanks, [name], CNM     Second Trimester of Pregnancy The second trimester is from week 14 through week 27 (months 4 through 6). The second trimester is often a time when you feel your best. Your body has adjusted to being pregnant, and you begin to feel better physically. Usually, morning sickness has lessened or quit completely, you may have more energy, and you may have an increase in appetite. The second trimester is also a time when the fetus is growing rapidly. At the end of the sixth month, the fetus is about 9 inches long and weighs about 1 pounds. You will likely begin to feel the baby move (quickening) between 16 and 20 weeks of pregnancy. Body changes during your second trimester Your body continues to go through many changes during your second trimester. The changes vary from woman to woman.  Your weight will continue to increase. You will notice your lower abdomen bulging out.  You may begin to get stretch marks on your hips, abdomen, and breasts.  You may develop headaches that can be relieved by medicines. The medicines should be approved by your health care provider.  You may urinate more often because the fetus is pressing on your bladder.  You may develop or continue to have heartburn as a result of your pregnancy.  You may develop constipation because certain hormones are causing the muscles that push waste through your intestines to slow down.  You may develop hemorrhoids or swollen, bulging veins (varicose veins).  You may have back pain. This is caused by: ? Weight gain. ? Pregnancy hormones that are relaxing the joints in your pelvis. ? A shift in weight and the muscles that support your balance.  Your breasts will continue to grow and they will continue to become tender.  Your gums may  bleed and may be sensitive to brushing and flossing.  Dark spots or blotches (chloasma, mask of pregnancy) may develop on your face. This will likely fade after the baby is born.  A dark line from your belly button to the pubic area (linea nigra) may appear. This will likely fade after the baby is born.  You may have changes in your hair. These can include thickening of your hair, rapid growth, and changes in texture. Some women also have hair loss during or after pregnancy, or hair that feels dry or thin. Your hair will most likely return to normal after your baby is born.  What to expect at prenatal visits During a routine prenatal visit:  You will be weighed to make sure you and the fetus are growing normally.  Your blood pressure will be taken.  Your abdomen will be measured to track your baby's growth.  The fetal heartbeat will be listened to.  Any test results from the previous visit will be discussed.  Your health care provider may ask you:  How you are feeling.  If you are feeling the baby move.  If you have had any abnormal symptoms, such as leaking fluid, bleeding, severe headaches, or abdominal cramping.  If you are using any tobacco products, including cigarettes, chewing tobacco, and electronic cigarettes.  If you have any questions.  Other tests that may be performed during your second trimester include:  Blood tests that check for: ? Low iron levels (anemia). ? High  blood sugar that affects pregnant women (gestational diabetes) between 42 and 28 weeks. ? Rh antibodies. This is to check for a protein on red blood cells (Rh factor).  Urine tests to check for infections, diabetes, or protein in the urine.  An ultrasound to confirm the proper growth and development of the baby.  An amniocentesis to check for possible genetic problems.  Fetal screens for spina bifida and Down syndrome.  HIV (human immunodeficiency virus) testing. Routine prenatal testing  includes screening for HIV, unless you choose not to have this test.  Follow these instructions at home: Medicines  Follow your health care provider's instructions regarding medicine use. Specific medicines may be either safe or unsafe to take during pregnancy.  Take a prenatal vitamin that contains at least 600 micrograms (mcg) of folic acid.  If you develop constipation, try taking a stool softener if your health care provider approves. Eating and drinking  Eat a balanced diet that includes fresh fruits and vegetables, whole grains, good sources of protein such as meat, eggs, or tofu, and low-fat dairy. Your health care provider will help you determine the amount of weight gain that is right for you.  Avoid raw meat and uncooked cheese. These carry germs that can cause birth defects in the baby.  If you have low calcium intake from food, talk to your health care provider about whether you should take a daily calcium supplement.  Limit foods that are high in fat and processed sugars, such as fried and sweet foods.  To prevent constipation: ? Drink enough fluid to keep your urine clear or pale yellow. ? Eat foods that are high in fiber, such as fresh fruits and vegetables, whole grains, and beans. Activity  Exercise only as directed by your health care provider. Most women can continue their usual exercise routine during pregnancy. Try to exercise for 30 minutes at least 5 days a week. Stop exercising if you experience uterine contractions.  Avoid heavy lifting, wear low heel shoes, and practice good posture.  A sexual relationship may be continued unless your health care provider directs you otherwise. Relieving pain and discomfort  Wear a good support bra to prevent discomfort from breast tenderness.  Take warm sitz baths to soothe any pain or discomfort caused by hemorrhoids. Use hemorrhoid cream if your health care provider approves.  Rest with your legs elevated if you have  leg cramps or low back pain.  If you develop varicose veins, wear support hose. Elevate your feet for 15 minutes, 3-4 times a day. Limit salt in your diet. Prenatal Care  Write down your questions. Take them to your prenatal visits.  Keep all your prenatal visits as told by your health care provider. This is important. Safety  Wear your seat belt at all times when driving.  Make a list of emergency phone numbers, including numbers for family, friends, the hospital, and police and fire departments. General instructions  Ask your health care provider for a referral to a local prenatal education class. Begin classes no later than the beginning of month 6 of your pregnancy.  Ask for help if you have counseling or nutritional needs during pregnancy. Your health care provider can offer advice or refer you to specialists for help with various needs.  Do not use hot tubs, steam rooms, or saunas.  Do not douche or use tampons or scented sanitary pads.  Do not cross your legs for long periods of time.  Avoid cat litter boxes and soil  used by cats. These carry germs that can cause birth defects in the baby and possibly loss of the fetus by miscarriage or stillbirth.  Avoid all smoking, herbs, alcohol, and unprescribed drugs. Chemicals in these products can affect the formation and growth of the baby.  Do not use any products that contain nicotine or tobacco, such as cigarettes and e-cigarettes. If you need help quitting, ask your health care provider.  Visit your dentist if you have not gone yet during your pregnancy. Use a soft toothbrush to brush your teeth and be gentle when you floss. Contact a health care provider if:  You have dizziness.  You have mild pelvic cramps, pelvic pressure, or nagging pain in the abdominal area.  You have persistent nausea, vomiting, or diarrhea.  You have a bad smelling vaginal discharge.  You have pain when you urinate. Get help right away if:  You  have a fever.  You are leaking fluid from your vagina.  You have spotting or bleeding from your vagina.  You have severe abdominal cramping or pain.  You have rapid weight gain or weight loss.  You have shortness of breath with chest pain.  You notice sudden or extreme swelling of your face, hands, ankles, feet, or legs.  You have not felt your baby move in over an hour.  You have severe headaches that do not go away when you take medicine.  You have vision changes. Summary  The second trimester is from week 14 through week 27 (months 4 through 6). It is also a time when the fetus is growing rapidly.  Your body goes through many changes during pregnancy. The changes vary from woman to woman.  Avoid all smoking, herbs, alcohol, and unprescribed drugs. These chemicals affect the formation and growth your baby.  Do not use any tobacco products, such as cigarettes, chewing tobacco, and e-cigarettes. If you need help quitting, ask your health care provider.  Contact your health care provider if you have any questions. Keep all prenatal visits as told by your health care provider. This is important. This information is not intended to replace advice given to you by your health care provider. Make sure you discuss any questions you have with your health care provider.      CHILDBIRTH CLASSES [phone] is the phone number for Pregnancy Classes or hospital tours at [facility] will be referred to  [email or web address] for more information on childbirth classes  At this site you may register for classes. You may sign up for a waiting list if classes are full. Please SIGN UP FOR THIS!.   When the waiting list becomes long, sometimes new classes can be added.

## 2017-10-14 NOTE — Progress Notes (Signed)
US 18+1 wks,cephalic,cx 4.3 cm,posterior pl gr 0,svp of fluid 4.8 cm,normal ovaries bilat,fhr 148 bpm,efw 276 g 94%,anatomy complete

## 2017-10-14 NOTE — Progress Notes (Signed)
HIGH-RISK PREGNANCY VISIT Patient name: [name] MRN [id number]  Date of birth: October 20, 1975 Chief Complaint:   High Risk Gestation (ultrasound)  History of Present Illness:   [name] is a 42 y.o. 355P3013 female at 2875w1d with an Estimated Date of Delivery: 03/16/18 being seen today for ongoing management of a high-risk pregnancy complicated by chronic HTN.  Today she reports no complaints.  .  .  Movement: Absent. denies leaking of fluid.  Review of Systems:   Pertinent items are noted in HPI Denies abnormal vaginal discharge w/ itching/odor/irritation, headaches, visual changes, shortness of breath, chest pain, abdominal pain, severe nausea/vomiting, or problems with urination or bowel movements unless otherwise stated above.    Pertinent History Reviewed:  Medical & Surgical Hx:   Past Medical History:  Diagnosis Date  . Abnormal Pap smear of cervix 02/05/2016  . Encounter for Nexplanon removal 01/02/2016  . GERD (gastroesophageal reflux disease)   . Patient desires pregnancy 02/01/2016   Past Surgical History:  Procedure Laterality Date  . NO PAST SURGERIES     Family History  Problem Relation Age of Onset  . Cancer Maternal Grandmother   . Diabetes Maternal Grandmother   . Hypertension Maternal Grandmother   . Alcoholism Maternal Grandfather   . Cirrhosis Maternal Grandfather   . Diabetes Mother   . Hypertension Mother   . Cleft lip Son   . Cleft palate Son   . Hypertension Maternal Aunt   . Hypertension Maternal Uncle     Current Outpatient Medications:  .  aspirin EC 81 MG tablet, Take 1 tablet (81 mg total) by mouth daily., Disp: 60 tablet, Rfl: 6 .  Prenatal Vit-Fe Fumarate-FA (MULTIVITAMIN-PRENATAL) 27-0.8 MG TABS tablet, Take 1 tablet by mouth daily at 12 noon., Disp: , Rfl:  .  acetaminophen (TYLENOL) 500 MG tablet, Take 1 tablet (500 mg total) by mouth every 6 (six) hours as needed. (Patient not taking: Reported on 09/30/2017), Disp: 30 tablet, Rfl: 0 Social  History: Reviewed -  reports that she has been smoking cigarettes.  She has a 5.50 pack-year smoking history. she has never used smokeless tobacco.   Physical Assessment:   Vitals:   10/14/17 1022  BP: 100/60  Pulse: 98  Weight: 169 lb (76.7 kg)  Body mass index is 28.12 kg/m.           Physical Examination:   General appearance: alert, well appearing, and in no distress  Mental status: alert, oriented to person, place, and time  Skin: warm & dry   Extremities: Edema: None    Cardiovascular: normal heart rate noted  Respiratory: normal respiratory effort, no distress  Abdomen: gravid, soft, non-tender  Pelvic: Cervical exam deferred         Fetal Status:     Movement: Absent    Fetal Surveillance Testing today: US 18+1 wks,cephalic,cx 4.3 cm,posterior pl gr 0,svp of fluid 4.8 cm,normal ovaries bilat,fhr 148 bpm,efw 276 g 94%,anatomy complete   No results found for this or any previous visit (from the past 24 hour(s)).  Assessment & Plan:  1) High-risk pregnancy G5P3013 at 2875w1d with an Estimated Date of Delivery: 03/16/18   2) CHTN, no meds, stable  3) ,   Labs/procedures today:   Medications: asa 81mg   Treatment Plan:   Growth u/s @ 20, 28, 34, 38wks     2x/wk testing nst/sono @ 32wks or weekly BPP   Deliver @ 40wks (no meds), 39wks (meds):______   Follow-up: Return in about 4 weeks (  around 11/11/2017) for HROB.  No orders of the defined types were placed in this encounter.  Scarlette Calico Cresenzo-Dishmon CNM 10/14/2017 10:52 AM

## 2017-10-20 ENCOUNTER — Telehealth: Payer: Self-pay | Admitting: Obstetrics and Gynecology

## 2017-10-20 NOTE — Telephone Encounter (Signed)
Patient called stating that she is pregnant and she has a bad cold. Pt would like to know what can she take. Please contact pt

## 2017-10-21 NOTE — Telephone Encounter (Signed)
Called patient back and informed her that for her head cold she can use mucinex and tylenol. Patient voiced understanding and had no further questions or concerns at this time.

## 2017-11-11 ENCOUNTER — Encounter: Payer: Self-pay | Admitting: Obstetrics and Gynecology

## 2017-11-11 ENCOUNTER — Ambulatory Visit (INDEPENDENT_AMBULATORY_CARE_PROVIDER_SITE_OTHER): Payer: Medicaid Other | Admitting: Obstetrics and Gynecology

## 2017-11-11 VITALS — BP 116/60 | HR 87 | Wt 172.6 lb

## 2017-11-11 DIAGNOSIS — O0990 Supervision of high risk pregnancy, unspecified, unspecified trimester: Secondary | ICD-10-CM

## 2017-11-11 DIAGNOSIS — O10912 Unspecified pre-existing hypertension complicating pregnancy, second trimester: Secondary | ICD-10-CM

## 2017-11-11 DIAGNOSIS — Z331 Pregnant state, incidental: Secondary | ICD-10-CM

## 2017-11-11 DIAGNOSIS — Z1389 Encounter for screening for other disorder: Secondary | ICD-10-CM

## 2017-11-11 DIAGNOSIS — Z3A22 22 weeks gestation of pregnancy: Secondary | ICD-10-CM

## 2017-11-11 DIAGNOSIS — O09522 Supervision of elderly multigravida, second trimester: Secondary | ICD-10-CM

## 2017-11-11 LAB — POCT URINALYSIS DIPSTICK
GLUCOSE UA: NEGATIVE
KETONES UA: NEGATIVE
Leukocytes, UA: NEGATIVE
Nitrite, UA: NEGATIVE
Protein, UA: NEGATIVE
RBC UA: NEGATIVE

## 2017-11-11 NOTE — Progress Notes (Signed)
HIGH-RISK PREGNANCY VISIT Patient name: [name] MRN [id number]  Date of birth: 12-21-1975 Chief Complaint:   High Risk Gestation (? braxton hicks contractions)  History of Present Illness:   [name] is a 42 y.o. 615P3013 female at 8645w1d with an Estimated Date of Delivery: 03/16/18 being seen today for ongoing management of a high-risk pregnancy complicated by chronic HTN.   Today she reports feeling braxton hicks contractions and hardness every 15 minutes or so yesterday. She wants her tubes tied for birth control after delivery.   Contractions: Irregular. Vag. Bleeding: None.  Movement: Present. denies leaking of fluid.  Review of Systems:   Pertinent items are noted in HPI Denies abnormal vaginal discharge w/ itching/odor/irritation, headaches, visual changes, shortness of breath, chest pain, abdominal pain, severe nausea/vomiting, or problems with urination or bowel movements unless otherwise stated above. Pertinent History Reviewed:  Reviewed past medical,surgical, social, obstetrical and family history.  Reviewed problem list, medications and allergies. Physical Assessment:   Vitals:   11/11/17 1002  BP: 116/60  Pulse: 87  Weight: 172 lb 9.6 oz (78.3 kg)  Body mass index is 28.72 kg/m.           Physical Examination:   General appearance: alert, well appearing, and in no distress and oriented to person, place, and time  Mental status: alert, oriented to person, place, and time, normal mood, behavior, speech, dress, motor activity, and thought processes  Skin: warm & dry   Extremities: Edema: None    Cardiovascular: normal heart rate noted  Respiratory: normal respiratory effort, no distress  Abdomen: gravid, soft, non-tender  Pelvic: Cervical exam deferred         Fetal Status: Fetal Heart Rate (bpm): 135 Fundal Height: 22 cm Movement: Present    Fetal Surveillance Testing today: Doppler  Results for orders placed or performed in visit on 11/11/17 (from the  past 24 hour(s))  POCT urinalysis dipstick   Collection Time: 11/11/17 10:03 AM  Result Value Ref Range   Color, UA     Clarity, UA     Glucose, UA neg    Bilirubin, UA     Ketones, UA neg    Spec Grav, UA  1.010 - 1.025   Blood, UA neg    pH, UA  5.0 - 8.0   Protein, UA neg    Urobilinogen, UA  0.2 or 1.0 E.U./dL   Nitrite, UA neg    Leukocytes, UA Negative Negative   Appearance     Odor      Assessment & Plan:  1) High-risk pregnancy [id number] at 4045w1d with an Estimated Date of Delivery: 03/16/18   2) Chronic HTN, no meds stable  3) AMA  Meds: ASA 81mg   Labs/procedures today: Doppler  Treatment Plan:  Growth u/s @ 20, 28, 34, 38wks     2x/wk testing nst/sono @ 32wks or weekly BPP   Deliver @ 40wks (no meds), 39wks , Wants tubal ligation Papers to be signed  Reviewed: Preterm labor symptoms and general obstetric precautions including but not limited to vaginal bleeding, contractions, leaking of fluid and fetal movement were reviewed in detail with the patient.  All questions were answered.  Follow-up: Return in about 1 month (around 12/09/2017), or if symptoms worsen or fail to improve, for HROB.  Orders Placed This Encounter  Procedures  . POCT urinalysis dipstick    By signing my name below, I, Izna Ahmed, attest that this documentation has been prepared under the direction and in the  presence of Tilda Burrow, MD. Electronically Signed: Redge Gainer, Medical Scribe. 11/11/17. 10:09 AM.  I personally performed the services described in this documentation, which was SCRIBED in my presence. The recorded information has been reviewed and considered accurate. It has been edited as necessary during review. Tilda Burrow, MD

## 2017-12-09 ENCOUNTER — Ambulatory Visit (INDEPENDENT_AMBULATORY_CARE_PROVIDER_SITE_OTHER): Payer: Medicaid Other | Admitting: Advanced Practice Midwife

## 2017-12-09 ENCOUNTER — Other Ambulatory Visit: Payer: Medicaid Other

## 2017-12-09 ENCOUNTER — Encounter: Payer: Self-pay | Admitting: Advanced Practice Midwife

## 2017-12-09 VITALS — BP 112/68 | HR 80 | Wt 175.0 lb

## 2017-12-09 DIAGNOSIS — O10919 Unspecified pre-existing hypertension complicating pregnancy, unspecified trimester: Secondary | ICD-10-CM

## 2017-12-09 DIAGNOSIS — O0990 Supervision of high risk pregnancy, unspecified, unspecified trimester: Secondary | ICD-10-CM

## 2017-12-09 DIAGNOSIS — Z1389 Encounter for screening for other disorder: Secondary | ICD-10-CM

## 2017-12-09 DIAGNOSIS — Z131 Encounter for screening for diabetes mellitus: Secondary | ICD-10-CM

## 2017-12-09 DIAGNOSIS — Z331 Pregnant state, incidental: Secondary | ICD-10-CM

## 2017-12-09 DIAGNOSIS — Z3A26 26 weeks gestation of pregnancy: Secondary | ICD-10-CM

## 2017-12-09 DIAGNOSIS — O10912 Unspecified pre-existing hypertension complicating pregnancy, second trimester: Secondary | ICD-10-CM

## 2017-12-09 LAB — POCT URINALYSIS DIPSTICK
Glucose, UA: NEGATIVE
Ketones, UA: NEGATIVE
Leukocytes, UA: NEGATIVE
Nitrite, UA: NEGATIVE
Protein, UA: NEGATIVE
RBC UA: NEGATIVE

## 2017-12-09 NOTE — Progress Notes (Signed)
HIGH-RISK PREGNANCY VISIT Patient name: [name] MRN [id number]  Date of birth: 24-Feb-1976 Chief Complaint:   Routine Prenatal Visit (PN2, "feeling sad") Scored 2 on PQ-9 History of Present Illness:   [name] is a 42 y.o. G45P3013 female at [redacted]w[redacted]d with an Estimated Date of Delivery: 03/16/18 being seen today for ongoing management of a high-risk pregnancy complicated by chronic HTN. No meds Today she reports getting irritated w/her family.  Sister is also pregnant, baby shower drama, etc. Offered counseling but declined, venting to her husband helps. Contractions: Irregular. Vag. Bleeding: None.  Movement: Present. denies leaking of fluid.  Review of Systems:   Pertinent items are noted in HPI Denies abnormal vaginal discharge w/ itching/odor/irritation, headaches, visual changes, shortness of breath, chest pain, abdominal pain, severe nausea/vomiting, or problems with urination or bowel movements unless otherwise stated above.    Pertinent History Reviewed:  Medical & Surgical Hx:   Past Medical History:  Diagnosis Date  . Abnormal Pap smear of cervix 02/05/2016  . Encounter for Nexplanon removal 01/02/2016  . GERD (gastroesophageal reflux disease)   . Patient desires pregnancy 02/01/2016   Past Surgical History:  Procedure Laterality Date  . NO PAST SURGERIES     Family History  Problem Relation Age of Onset  . Cancer Maternal Grandmother   . Diabetes Maternal Grandmother   . Hypertension Maternal Grandmother   . Alcoholism Maternal Grandfather   . Cirrhosis Maternal Grandfather   . Diabetes Mother   . Hypertension Mother   . Cleft lip Son   . Cleft palate Son   . Hypertension Maternal Aunt   . Hypertension Maternal Uncle     Current Outpatient Medications:  .  aspirin EC 81 MG tablet, Take 1 tablet (81 mg total) by mouth daily. (Patient taking differently: Take 162 mg by mouth daily. ), Disp: 60 tablet, Rfl: 6 .  Pediatric Multiple Vit-C-FA (FLINSTONES GUMMIES OMEGA-3  DHA PO), Take by mouth. Takes 2 daily, Disp: , Rfl:  Social History: Reviewed -  reports that she has been smoking cigarettes.  She has a 5.50 pack-year smoking history. She has never used smokeless tobacco.   Physical Assessment:   Vitals:   12/09/17 0923  BP: 112/68  Pulse: 80  Weight: 175 lb (79.4 kg)  Body mass index is 29.12 kg/m.           Physical Examination:   General appearance: alert, well appearing, and in no distress  Mental status: alert, oriented to person, place, and time  Skin: warm & dry   Extremities: Edema: None    Cardiovascular: normal heart rate noted  Respiratory: normal respiratory effort, no distress  Abdomen: gravid, soft, non-tender  Pelvic: Cervical exam deferred         Fetal Status: Fetal Heart Rate (bpm): 148 Fundal Height: 26 cm Movement: Present    Fetal Surveillance Testing today: doppler  Results for orders placed or performed in visit on 12/09/17 (from the past 24 hour(s))  POCT urinalysis dipstick   Collection Time: 12/09/17  9:29 AM  Result Value Ref Range   Color, UA     Clarity, UA     Glucose, UA neg    Bilirubin, UA     Ketones, UA neg    Spec Grav, UA  1.010 - 1.025   Blood, UA neg    pH, UA  5.0 - 8.0   Protein, UA neg    Urobilinogen, UA  0.2 or 1.0 E.U./dL   Nitrite, UA neg  Leukocytes, UA Negative Negative   Appearance     Odor      Assessment & Plan:  1) High-risk pregnancy G5P3013 at 650w1d with an Estimated Date of Delivery: 03/16/18   2) CHTN, no meds, stable  3) ,   Labs/procedures today: PN2 today  Medications: ASA 162 mg qd  Treatment Plan:  Growth u/s @ 28, 34, 38wks 2x/wk testing nst/sono @ 32wks or weekly BPP Deliver @ 40wks (no meds),    Follow-up: Return in about 2 weeks (around 12/23/2017) for US:EFW, HROB,  , sign BTL form.  Orders Placed This Encounter  Procedures  . US OB Follow Up  . POCT urinalysis dipstick   Jacklyn ShellFrances Cresenzo-Dishmon CNM 12/09/2017 10:00 AM

## 2017-12-10 LAB — HIV ANTIBODY (ROUTINE TESTING W REFLEX): HIV SCREEN 4TH GENERATION: NONREACTIVE

## 2017-12-10 LAB — RPR: RPR: NONREACTIVE

## 2017-12-10 LAB — CBC
HEMATOCRIT: 32.9 % — AB (ref 34.0–46.6)
HEMOGLOBIN: 11.1 g/dL (ref 11.1–15.9)
MCH: 31.5 pg (ref 26.6–33.0)
MCHC: 33.7 g/dL (ref 31.5–35.7)
MCV: 94 fL (ref 79–97)
Platelets: 332 10*3/uL (ref 150–379)
RBC: 3.52 x10E6/uL — AB (ref 3.77–5.28)
RDW: 12.5 % (ref 12.3–15.4)
WBC: 10.8 10*3/uL (ref 3.4–10.8)

## 2017-12-10 LAB — GLUCOSE TOLERANCE, 2 HOURS W/ 1HR
GLUCOSE, 2 HOUR: 125 mg/dL (ref 65–152)
Glucose, 1 hour: 160 mg/dL (ref 65–179)
Glucose, Fasting: 78 mg/dL (ref 65–91)

## 2017-12-10 LAB — ANTIBODY SCREEN: Antibody Screen: NEGATIVE

## 2017-12-23 ENCOUNTER — Ambulatory Visit (INDEPENDENT_AMBULATORY_CARE_PROVIDER_SITE_OTHER): Payer: Medicaid Other

## 2017-12-23 ENCOUNTER — Ambulatory Visit (INDEPENDENT_AMBULATORY_CARE_PROVIDER_SITE_OTHER): Payer: Medicaid Other | Admitting: Advanced Practice Midwife

## 2017-12-23 ENCOUNTER — Encounter: Payer: Self-pay | Admitting: Advanced Practice Midwife

## 2017-12-23 VITALS — BP 110/60 | HR 96 | Wt 175.0 lb

## 2017-12-23 DIAGNOSIS — O0990 Supervision of high risk pregnancy, unspecified, unspecified trimester: Secondary | ICD-10-CM

## 2017-12-23 DIAGNOSIS — O10913 Unspecified pre-existing hypertension complicating pregnancy, third trimester: Secondary | ICD-10-CM

## 2017-12-23 DIAGNOSIS — Z3A28 28 weeks gestation of pregnancy: Secondary | ICD-10-CM

## 2017-12-23 DIAGNOSIS — Z1389 Encounter for screening for other disorder: Secondary | ICD-10-CM

## 2017-12-23 DIAGNOSIS — O10919 Unspecified pre-existing hypertension complicating pregnancy, unspecified trimester: Secondary | ICD-10-CM

## 2017-12-23 DIAGNOSIS — O09523 Supervision of elderly multigravida, third trimester: Secondary | ICD-10-CM

## 2017-12-23 DIAGNOSIS — O0993 Supervision of high risk pregnancy, unspecified, third trimester: Secondary | ICD-10-CM

## 2017-12-23 DIAGNOSIS — Z331 Pregnant state, incidental: Secondary | ICD-10-CM

## 2017-12-23 LAB — POCT URINALYSIS DIPSTICK
Blood, UA: NEGATIVE
Glucose, UA: NEGATIVE
KETONES UA: NEGATIVE
Leukocytes, UA: NEGATIVE
NITRITE UA: NEGATIVE
PROTEIN UA: NEGATIVE

## 2017-12-23 NOTE — Progress Notes (Signed)
HIGH-RISK PREGNANCY VISIT Patient name: Yvonne EShaasia [id number]  Date of birth: 09-17-1975 Chief Complaint:   High Risk Gestation (ultrasound)  History of Present Illness:   [name] is a 42 y.o. G65P3013 female at [redacted]w[redacted]d with an Estimated Date of Delivery: 03/16/18 being seen today for ongoing management of a high-risk pregnancy complicated by chronic HTN. AMA Today she reports no complaints. Contractions: Irregular.  .  Movement: Present. denies leaking of fluid.  Review of Systems:   Pertinent items are noted in HPI Denies abnormal vaginal discharge w/ itching/odor/irritation, headaches, visual changes, shortness of breath, chest pain, abdominal pain, severe nausea/vomiting, or problems with urination or bowel movements unless otherwise stated above.    Pertinent History Reviewed:  Medical & Surgical Hx:   Past Medical History:  Diagnosis Date  . Abnormal Pap smear of cervix 02/05/2016  . Encounter for Nexplanon removal 01/02/2016  . GERD (gastroesophageal reflux disease)   . Patient desires pregnancy 02/01/2016   Past Surgical History:  Procedure Laterality Date  . NO PAST SURGERIES     Family History  Problem Relation Age of Onset  . Cancer Maternal Grandmother   . Diabetes Maternal Grandmother   . Hypertension Maternal Grandmother   . Alcoholism Maternal Grandfather   . Cirrhosis Maternal Grandfather   . Diabetes Mother   . Hypertension Mother   . Cleft lip Son   . Cleft palate Son   . Hypertension Maternal Aunt   . Hypertension Maternal Uncle     Current Outpatient Medications:  .  aspirin EC 81 MG tablet, Take 1 tablet (81 mg total) by mouth daily. (Patient taking differently: Take 162 mg by mouth daily. ), Disp: 60 tablet, Rfl: 6 .  Pediatric Multiple Vit-C-FA (FLINSTONES GUMMIES OMEGA-3 DHA PO), Take by mouth. Takes 2 daily, Disp: , Rfl:  Social History: Reviewed -  reports that she has been smoking cigarettes.  She has a 5.50 pack-year smoking history. She has  never used smokeless tobacco.   Physical Assessment:   Vitals:   12/23/17 1003  BP: 110/60  Pulse: 96  Weight: 175 lb (79.4 kg)  Body mass index is 29.12 kg/m.           Physical Examination:   General appearance: alert, well appearing, and in no distress  Mental status: alert, oriented to person, place, and time  Skin: warm & dry   Extremities: Edema: Trace    Cardiovascular: normal heart rate noted  Respiratory: normal respiratory effort, no distress  Abdomen: gravid, soft, non-tender  Pelvic: Cervical exam deferred         Fetal Status:     Movement: Present    Fetal Surveillance Testing today: [place] 28+1 wks,cephalic,cx 4.5 cm,posterior pl gr 0 w/a anterior fundal accessory lobe,cx 4.5 cm,afi 15 cm,fhr 141 bpm,EFW 1332 g 72 %    Results for orders placed or performed in visit on 12/23/17 (from the past 24 hour(s))  POCT urinalysis dipstick   Collection Time: 12/23/17 10:12 AM  Result Value Ref Range   Color, UA     Clarity, UA     Glucose, UA neg    Bilirubin, UA     Ketones, UA neg    Spec Grav, UA  1.010 - 1.025   Blood, UA neg    pH, UA  5.0 - 8.0   Protein, UA neg    Urobilinogen, UA  0.2 or 1.0 E.U./dL   Nitrite, UA neg    Leukocytes, UA Negative Negative  Appearance     Odor      Assessment & Plan:  1) High-risk pregnancy G5P3013 at [redacted]w[redacted]d with an Estimated Date of Delivery: 03/16/18   2) CHTN, no meds, stable  3) AMA,   Labs/procedures today:   Medications: asa   Treatment Plan:    Growth u/s @ 34, 38wks 2x/wk testing nst/sono @ 32wks Deliver @ 40wks (no meds),    Follow-up: Return in about 1 month (around 01/21/2018) for HROB, US:BPP, US:EFW; dopplers.  Orders Placed This Encounter  Procedures  . US OB Follow Up  . US FETAL BPP WO NON STRESS  . [place] UA Cord Doppler  . POCT urinalysis dipstick   Jacklyn Shell CNM 12/23/2017 10:50 AM

## 2017-12-23 NOTE — Progress Notes (Signed)
[place] 28+1 wks,cephalic,cx 4.5 cm,posterior pl gr 0 w/a anterior fundal accessory lobe,cx 4.5 cm,afi 15 cm,fhr 141 bpm,EFW 1332 g 72 %

## 2018-01-21 ENCOUNTER — Ambulatory Visit (INDEPENDENT_AMBULATORY_CARE_PROVIDER_SITE_OTHER): Payer: Medicaid Other | Admitting: Obstetrics & Gynecology

## 2018-01-21 ENCOUNTER — Encounter: Payer: Self-pay | Admitting: Obstetrics & Gynecology

## 2018-01-21 ENCOUNTER — Ambulatory Visit (INDEPENDENT_AMBULATORY_CARE_PROVIDER_SITE_OTHER): Payer: Medicaid Other

## 2018-01-21 VITALS — BP 105/69 | HR 71 | Wt 175.6 lb

## 2018-01-21 DIAGNOSIS — O10913 Unspecified pre-existing hypertension complicating pregnancy, third trimester: Secondary | ICD-10-CM | POA: Diagnosis not present

## 2018-01-21 DIAGNOSIS — O10919 Unspecified pre-existing hypertension complicating pregnancy, unspecified trimester: Secondary | ICD-10-CM

## 2018-01-21 DIAGNOSIS — O09523 Supervision of elderly multigravida, third trimester: Secondary | ICD-10-CM

## 2018-01-21 DIAGNOSIS — Z3A32 32 weeks gestation of pregnancy: Secondary | ICD-10-CM | POA: Diagnosis not present

## 2018-01-21 DIAGNOSIS — O360130 Maternal care for anti-D [Rh] antibodies, third trimester, not applicable or unspecified: Secondary | ICD-10-CM | POA: Diagnosis not present

## 2018-01-21 DIAGNOSIS — O0990 Supervision of high risk pregnancy, unspecified, unspecified trimester: Secondary | ICD-10-CM

## 2018-01-21 DIAGNOSIS — Z6791 Unspecified blood type, Rh negative: Secondary | ICD-10-CM

## 2018-01-21 DIAGNOSIS — O0993 Supervision of high risk pregnancy, unspecified, third trimester: Secondary | ICD-10-CM

## 2018-01-21 DIAGNOSIS — O26899 Other specified pregnancy related conditions, unspecified trimester: Secondary | ICD-10-CM

## 2018-01-21 DIAGNOSIS — Z1389 Encounter for screening for other disorder: Secondary | ICD-10-CM

## 2018-01-21 DIAGNOSIS — Z331 Pregnant state, incidental: Secondary | ICD-10-CM

## 2018-01-21 LAB — POCT URINALYSIS DIPSTICK
GLUCOSE UA: NEGATIVE
Ketones, UA: NEGATIVE
Leukocytes, UA: NEGATIVE
Nitrite, UA: NEGATIVE
Protein, UA: NEGATIVE
RBC UA: NEGATIVE

## 2018-01-21 NOTE — Progress Notes (Signed)
   HIGH-RISK PREGNANCY VISIT Patient name: [name] MRN [id number]  Date of birth: 1976/06/08 Chief Complaint:   Routine Prenatal Visit  History of Present Illness:   [name] is a 42 y.o. 85P3013 female at 218w2d with an Estimated Date of Delivery: 03/16/18 being seen today for ongoing management of a high-risk pregnancy complicated by chronic HTN, AMA.  Today she reports no complaints. Contractions: Irritability. Vag. Bleeding: None.  Movement: Present. denies leaking of fluid.  Review of Systems:   Pertinent items are noted in HPI Denies abnormal vaginal discharge w/ itching/odor/irritation, headaches, visual changes, shortness of breath, chest pain, abdominal pain, severe nausea/vomiting, or problems with urination or bowel movements unless otherwise stated above. Pertinent History Reviewed:  Reviewed past medical,surgical, social, obstetrical and family history.  Reviewed problem list, medications and allergies. Physical Assessment:   Vitals:   01/21/18 1030  BP: 105/69  Pulse: 71  Weight: 175 lb 9.6 oz (79.7 kg)  Body mass index is 29.22 kg/m.           Physical Examination:   General appearance: alert, well appearing, and in no distress  Mental status: alert, oriented to person, place, and time  Skin: warm & dry   Extremities: Edema: Trace    Cardiovascular: normal heart rate noted  Respiratory: normal respiratory effort, no distress  Abdomen: gravid, soft, non-tender  Pelvic: Cervical exam deferred         Fetal Status:     Movement: Present    Fetal Surveillance Testing today: BPP 6/8(no breathing) Reactive NST, 8/10   Results for orders placed or performed in visit on 01/21/18 (from the past 24 hour(s))  POCT Urinalysis Dipstick   Collection Time: 01/21/18 10:32 AM  Result Value Ref Range   Color, UA     Clarity, UA     Glucose, UA Negative Negative   Bilirubin, UA     Ketones, UA neg    Spec Grav, UA  1.010 - 1.025   Blood, UA neg    pH, UA  5.0 -  8.0   Protein, UA Negative Negative   Urobilinogen, UA  0.2 or 1.0 E.U./dL   Nitrite, UA neg    Leukocytes, UA Negative Negative   Appearance     Odor      Assessment & Plan:  1) High-risk pregnancy G5P3013 at 4618w2d with an Estimated Date of Delivery: 03/16/18   2) CHTN, stable, no meds  3) AMA, stable  Meds: No orders of the defined types were placed in this encounter.   Labs/procedures today: BPP + NST  Treatment Plan:  Twice weekly surveillance, sonogram alternating with NST, induction at 39 weeks or as clinically indicated Will do 2 NST next week due to absence of sonographer   Reviewed: Preterm labor symptoms and general obstetric precautions including but not limited to vaginal bleeding, contractions, leaking of fluid and fetal movement were reviewed in detail with the patient.  All questions were answered.  Follow-up: Return in about 4 days (around 01/25/2018) for NST, HROB.  Orders Placed This Encounter  Procedures  . RHO (D) Immune Globulin  . POCT Urinalysis Dipstick   Amaryllis DykeLuther H Tamre Cass  01/21/2018 11:22 AM

## 2018-01-21 NOTE — Progress Notes (Signed)
US 32+2 wks,cephalic,BPP 6/8 no breathing,posterior pl gr 1 w/anterior accessory lobe,afi 11 cm,fhr 140 bpm,normal ovaries bilat,efw 14923 g 37%,RI .70,.68=86%

## 2018-01-25 ENCOUNTER — Other Ambulatory Visit: Payer: Self-pay

## 2018-01-25 ENCOUNTER — Ambulatory Visit (INDEPENDENT_AMBULATORY_CARE_PROVIDER_SITE_OTHER): Payer: Medicaid Other | Admitting: Obstetrics and Gynecology

## 2018-01-25 ENCOUNTER — Encounter: Payer: Self-pay | Admitting: Obstetrics and Gynecology

## 2018-01-25 VITALS — BP 100/60 | HR 82 | Wt 175.6 lb

## 2018-01-25 DIAGNOSIS — Z3A32 32 weeks gestation of pregnancy: Secondary | ICD-10-CM | POA: Diagnosis not present

## 2018-01-25 DIAGNOSIS — Z331 Pregnant state, incidental: Secondary | ICD-10-CM

## 2018-01-25 DIAGNOSIS — O09523 Supervision of elderly multigravida, third trimester: Secondary | ICD-10-CM

## 2018-01-25 DIAGNOSIS — O10913 Unspecified pre-existing hypertension complicating pregnancy, third trimester: Secondary | ICD-10-CM | POA: Diagnosis not present

## 2018-01-25 DIAGNOSIS — Z1389 Encounter for screening for other disorder: Secondary | ICD-10-CM

## 2018-01-25 DIAGNOSIS — O0993 Supervision of high risk pregnancy, unspecified, third trimester: Secondary | ICD-10-CM

## 2018-01-25 LAB — POCT URINALYSIS DIPSTICK
Blood, UA: NEGATIVE
Glucose, UA: NEGATIVE
Ketones, UA: NEGATIVE
LEUKOCYTES UA: NEGATIVE
NITRITE UA: NEGATIVE
PROTEIN UA: NEGATIVE

## 2018-01-25 NOTE — Progress Notes (Signed)
Patient ID: [name], female   DOB: [birth year], 42 y.o.   MRN: [id number]    [facility] Patient name: [name] MRN [id number]  Date of birth: [birth year] Chief Complaint:   High Risk Gestation (NST)  History of Present Illness:   [name] is a 42 y.o. 505P3013 female at 6471w6d with an Estimated Date of Delivery: 03/16/18 being seen today for ongoing management of a high-risk pregnancy complicated by chronic HTN, AMA. This is pt 4th child.  The pt will have tubal ligation after delivery Today she reports no complaints. Contractions: Irregular. Vag. Bleeding: None.  Movement: Present. denies leaking of fluid.  Review of Systems:   Pertinent items are noted in HPI Denies abnormal vaginal discharge w/ itching/odor/irritation, headaches, visual changes, shortness of breath, chest pain, abdominal pain, severe nausea/vomiting, or problems with urination or bowel movements unless otherwise stated above. Pertinent History Reviewed:  Reviewed past medical,surgical, social, obstetrical and family history.  Reviewed problem list, medications and allergies. Physical Assessment:   Vitals:   01/25/18 1352  BP: 100/60  Pulse: 82  Weight: 175 lb 9.6 oz (79.7 kg)  Body mass index is 29.22 kg/m.           Physical Examination:   General appearance: alert, well appearing, and in no distress and oriented to person, place, and time  Mental status: alert, oriented to person, place, and time, normal mood, behavior, speech, dress, motor activity, and thought processes, affect appropriate to mood  Skin: warm & dry   Extremities: Edema: Trace    Cardiovascular: normal heart rate noted  Respiratory: normal respiratory effort, no distress  Abdomen: gravid, soft, non-tender  Pelvic: Cervical exam deferred         Fetal Status:   Fundal Height: 31 cm Movement: Present    Fetal Surveillance Testing today: NST  Results for orders placed or performed in visit on 01/25/18 (from the past  24 hour(s))  POCT urinalysis dipstick   Collection Time: 01/25/18  1:54 PM  Result Value Ref Range   Color, UA     Clarity, UA     Glucose, UA Negative Negative   Bilirubin, UA     Ketones, UA neg    Spec Grav, UA  1.010 - 1.025   Blood, UA neg    pH, UA  5.0 - 8.0   Protein, UA Negative Negative   Urobilinogen, UA  0.2 or 1.0 E.U./dL   Nitrite, UA neg    Leukocytes, UA Negative Negative   Appearance     Odor      Assessment & Plan:  1) High-risk pregnancy [id number] at 2971w6d with an Estimated Date of Delivery: 03/16/18   2) CHTN, stable  3) AMA, stable  Meds: No orders of the defined types were placed in this encounter.   Labs/procedures today: NST reactive  Treatment Plan:  Continue routine obstetrical care  Follow-up: No follow-ups on file.  Orders Placed This Encounter  Procedures  . POCT urinalysis dipstick   By signing my name below, I, Arnette NorrisMari Johnson, attest that this documentation has been prepared under the direction and in the presence of Tilda BurrowFerguson, Rasheena Talmadge V, MD. Electronically Signed: Arnette NorrisMari Johnson Medical Scribe. 01/25/18. 2:13 PM.  I personally performed the services described in this documentation, which was SCRIBED in my presence. The recorded information has been reviewed and considered accurate. It has been edited as necessary during review. Tilda BurrowJohn V Cuyler Vandyken, MD

## 2018-01-28 ENCOUNTER — Encounter: Payer: Self-pay | Admitting: Advanced Practice Midwife

## 2018-01-28 ENCOUNTER — Ambulatory Visit (INDEPENDENT_AMBULATORY_CARE_PROVIDER_SITE_OTHER): Payer: Medicaid Other | Admitting: Advanced Practice Midwife

## 2018-01-28 VITALS — BP 93/61 | HR 81 | Wt 177.3 lb

## 2018-01-28 DIAGNOSIS — O10913 Unspecified pre-existing hypertension complicating pregnancy, third trimester: Secondary | ICD-10-CM

## 2018-01-28 DIAGNOSIS — O0993 Supervision of high risk pregnancy, unspecified, third trimester: Secondary | ICD-10-CM

## 2018-01-28 DIAGNOSIS — Z331 Pregnant state, incidental: Secondary | ICD-10-CM

## 2018-01-28 DIAGNOSIS — O99333 Smoking (tobacco) complicating pregnancy, third trimester: Secondary | ICD-10-CM | POA: Diagnosis not present

## 2018-01-28 DIAGNOSIS — Z3A33 33 weeks gestation of pregnancy: Secondary | ICD-10-CM

## 2018-01-28 DIAGNOSIS — O10919 Unspecified pre-existing hypertension complicating pregnancy, unspecified trimester: Secondary | ICD-10-CM

## 2018-01-28 DIAGNOSIS — O09523 Supervision of elderly multigravida, third trimester: Secondary | ICD-10-CM

## 2018-01-28 DIAGNOSIS — Z1389 Encounter for screening for other disorder: Secondary | ICD-10-CM

## 2018-01-28 DIAGNOSIS — F17210 Nicotine dependence, cigarettes, uncomplicated: Secondary | ICD-10-CM

## 2018-01-28 LAB — POCT URINALYSIS DIPSTICK
Blood, UA: NEGATIVE
GLUCOSE UA: NEGATIVE
Leukocytes, UA: NEGATIVE
Nitrite, UA: NEGATIVE
PROTEIN UA: POSITIVE — AB

## 2018-01-28 NOTE — Progress Notes (Signed)
HIGH-RISK PREGNANCY VISIT Patient name: [name] MRN [id number]  Date of birth: 1976/02/11 Chief Complaint:   No chief complaint on file.  History of Present Illness:   [name] is a 42 y.o. [phone] female at [redacted]w[redacted]d with an Estimated Date of Delivery: 03/16/18 being seen today for ongoing management of a high-risk pregnancy complicated by chronic HTN. Smoker and AMA Today she reports no complaints. Contractions: Irregular.  .  Movement: Present. denies leaking of fluid.  Review of Systems:   Pertinent items are noted in HPI Denies abnormal vaginal discharge w/ itching/odor/irritation, headaches, visual changes, shortness of breath, chest pain, abdominal pain, severe nausea/vomiting, or problems with urination or bowel movements unless otherwise stated above.    Pertinent History Reviewed:  Medical & Surgical Hx:   Past Medical History:  Diagnosis Date  . Abnormal Pap smear of cervix 02/05/2016  . Encounter for Nexplanon removal 01/02/2016  . GERD (gastroesophageal reflux disease)   . Patient desires pregnancy 02/01/2016   Past Surgical History:  Procedure Laterality Date  . NO PAST SURGERIES     Family History  Problem Relation Age of Onset  . Cancer Maternal Grandmother   . Diabetes Maternal Grandmother   . Hypertension Maternal Grandmother   . Alcoholism Maternal Grandfather   . Cirrhosis Maternal Grandfather   . Diabetes Mother   . Hypertension Mother   . Cleft lip Son   . Cleft palate Son   . Hypertension Maternal Aunt   . Hypertension Maternal Uncle     Current Outpatient Medications:  .  aspirin EC 81 MG tablet, Take 1 tablet (81 mg total) by mouth daily. (Patient taking differently: Take 162 mg by mouth daily. ), Disp: 60 tablet, Rfl: 6 .  Pediatric Multiple Vit-C-FA (FLINSTONES GUMMIES OMEGA-3 DHA PO), Take by mouth. Takes 2 daily, Disp: , Rfl:  .  ranitidine (ZANTAC) 150 MG tablet, Take 150 mg by mouth 2 (two) times daily., Disp: , Rfl:  Social History:  Reviewed -  reports that she has been smoking cigarettes.  She has a 5.50 pack-year smoking history. She has never used smokeless tobacco.   Physical Assessment:   Vitals:   01/28/18 1000  BP: 93/61  Pulse: 81  Weight: 177 lb 4.8 oz (80.4 kg)  Body mass index is 29.5 kg/m.           Physical Examination:   General appearance: alert, well appearing, and in no distress  Mental status: alert, oriented to person, place, and time  Skin: warm & dry   Extremities: Edema: Trace    Cardiovascular: normal heart rate noted  Respiratory: normal respiratory effort, no distress  Abdomen: gravid, soft, non-tender  Pelvic: Cervical exam deferred         Fetal Status:     Movement: Present    Fetal Surveillance Testing today: NST: FHR baseline 135 bpm, Variability: moderate, Accelerations:present, Decelerations:  Absent= Cat 1/Reactive   Results for orders placed or performed in visit on 01/28/18 (from the past 24 hour(s))  POCT urinalysis dipstick   Collection Time: 01/28/18 10:05 AM  Result Value Ref Range   Color, UA     Clarity, UA     Glucose, UA Negative Negative   Bilirubin, UA     Ketones, UA small    Spec Grav, UA  1.010 - 1.025   Blood, UA neg    pH, UA  5.0 - 8.0   Protein, UA Positive (A) Negative   Urobilinogen, UA  0.2 or 1.0  E.U./dL   Nitrite, UA neg    Leukocytes, UA Negative Negative   Appearance     Odor      Assessment & Plan:  1) High-risk pregnancy G5P3013 at 3890w2d with an Estimated Date of Delivery: 03/16/18   2) CHTN, no meds, stable  3) AMA,   Labs/procedures today: none  Medications: asa 162mg   Treatment Plan:  Twice weekly testing, IOL 40 weeks  Follow-up: Return for Mondays:  NST/HROB; Thursdays:  US/HROB.  Orders Placed This Encounter  Procedures  . US FETAL BPP WO NON STRESS  . US OB Follow Up  . US UA Cord Doppler  . POCT urinalysis dipstick   Jacklyn ShellFrances Cresenzo-Dishmon CNM 01/28/2018 10:25 AM

## 2018-02-01 ENCOUNTER — Ambulatory Visit (INDEPENDENT_AMBULATORY_CARE_PROVIDER_SITE_OTHER): Payer: Medicaid Other | Admitting: Obstetrics & Gynecology

## 2018-02-01 ENCOUNTER — Other Ambulatory Visit: Payer: Self-pay

## 2018-02-01 ENCOUNTER — Encounter: Payer: Self-pay | Admitting: Obstetrics & Gynecology

## 2018-02-01 VITALS — BP 109/65 | HR 87 | Wt 177.0 lb

## 2018-02-01 DIAGNOSIS — Z3A33 33 weeks gestation of pregnancy: Secondary | ICD-10-CM | POA: Diagnosis not present

## 2018-02-01 DIAGNOSIS — Z1389 Encounter for screening for other disorder: Secondary | ICD-10-CM

## 2018-02-01 DIAGNOSIS — O09523 Supervision of elderly multigravida, third trimester: Secondary | ICD-10-CM

## 2018-02-01 DIAGNOSIS — Z331 Pregnant state, incidental: Secondary | ICD-10-CM

## 2018-02-01 DIAGNOSIS — O0993 Supervision of high risk pregnancy, unspecified, third trimester: Secondary | ICD-10-CM

## 2018-02-01 DIAGNOSIS — O10913 Unspecified pre-existing hypertension complicating pregnancy, third trimester: Secondary | ICD-10-CM

## 2018-02-01 DIAGNOSIS — O10919 Unspecified pre-existing hypertension complicating pregnancy, unspecified trimester: Secondary | ICD-10-CM

## 2018-02-01 LAB — POCT URINALYSIS DIPSTICK
Blood, UA: NEGATIVE
GLUCOSE UA: NEGATIVE
Ketones, UA: NEGATIVE
Leukocytes, UA: NEGATIVE
Nitrite, UA: NEGATIVE
Protein, UA: NEGATIVE

## 2018-02-01 NOTE — Progress Notes (Signed)
   HIGH-RISK PREGNANCY VISIT Patient name: [name] MRN [id number]  Date of birth: 06-15-1976 Chief Complaint:   High Risk Gestation (NST)  History of Present Illness:   [name] is a 42 y.o. 465P3013 female at 2239w6d with an Estimated Date of Delivery: 03/16/18 being seen today for ongoing management of a high-risk pregnancy complicated by chronic HTN, AMA.  Today she reports no complaints. Contractions: Not present. Vag. Bleeding: None.  Movement: Present. denies leaking of fluid.  Review of Systems:   Pertinent items are noted in HPI Denies abnormal vaginal discharge w/ itching/odor/irritation, headaches, visual changes, shortness of breath, chest pain, abdominal pain, severe nausea/vomiting, or problems with urination or bowel movements unless otherwise stated above. Pertinent History Reviewed:  Reviewed past medical,surgical, social, obstetrical and family history.  Reviewed problem list, medications and allergies. Physical Assessment:   Vitals:   02/01/18 1036  BP: 109/65  Pulse: 87  Weight: 177 lb (80.3 kg)  Body mass index is 29.45 kg/m.           Physical Examination:   General appearance: alert, well appearing, and in no distress  Mental status: alert, oriented to person, place, and time  Skin: warm & dry   Extremities: Edema: Trace    Cardiovascular: normal heart rate noted  Respiratory: normal respiratory effort, no distress  Abdomen: gravid, soft, non-tender  Pelvic: Cervical exam deferred         Fetal Status: Fetal Heart Rate (bpm): 135 Fundal Height: 33 cm Movement: Present    Fetal Surveillance Testing today: Reactive NST   Results for orders placed or performed in visit on 02/01/18 (from the past 24 hour(s))  POCT urinalysis dipstick   Collection Time: 02/01/18 10:37 AM  Result Value Ref Range   Color, UA     Clarity, UA     Glucose, UA Negative Negative   Bilirubin, UA     Ketones, UA neg    Spec Grav, UA  1.010 - 1.025   Blood, UA neg    pH,  UA  5.0 - 8.0   Protein, UA Negative Negative   Urobilinogen, UA  0.2 or 1.0 E.U./dL   Nitrite, UA neg    Leukocytes, UA Negative Negative   Appearance     Odor      Assessment & Plan:  1) High-risk pregnancy [id number] at 6939w6d with an Estimated Date of Delivery: 03/16/18   2) CHTN, stable, no meds  3) AMA, stable, age 42   Meds: No orders of the defined types were placed in this encounter.   Labs/procedures today: Reactive NST  Treatment Plan:  Twice weekly surveillance, IOL 39 weeks  Reviewed: Preterm labor symptoms and general obstetric precautions including but not limited to vaginal bleeding, contractions, leaking of fluid and fetal movement were reviewed in detail with the patient.  All questions were answered.  Follow-up: Return in about 3 days (around 02/04/2018) for NST, HROB.  Orders Placed This Encounter  Procedures  . POCT urinalysis dipstick   Lazaro ArmsLuther H Eure  02/01/2018 11:23 AM

## 2018-02-04 ENCOUNTER — Encounter: Payer: Self-pay | Admitting: Obstetrics & Gynecology

## 2018-02-04 ENCOUNTER — Ambulatory Visit (INDEPENDENT_AMBULATORY_CARE_PROVIDER_SITE_OTHER): Payer: Medicaid Other | Admitting: Obstetrics & Gynecology

## 2018-02-04 VITALS — BP 112/70 | HR 86 | Wt 177.0 lb

## 2018-02-04 DIAGNOSIS — O09523 Supervision of elderly multigravida, third trimester: Secondary | ICD-10-CM

## 2018-02-04 DIAGNOSIS — O0993 Supervision of high risk pregnancy, unspecified, third trimester: Secondary | ICD-10-CM

## 2018-02-04 DIAGNOSIS — Z3A34 34 weeks gestation of pregnancy: Secondary | ICD-10-CM | POA: Diagnosis not present

## 2018-02-04 DIAGNOSIS — Z331 Pregnant state, incidental: Secondary | ICD-10-CM

## 2018-02-04 DIAGNOSIS — O10913 Unspecified pre-existing hypertension complicating pregnancy, third trimester: Secondary | ICD-10-CM

## 2018-02-04 DIAGNOSIS — Z1389 Encounter for screening for other disorder: Secondary | ICD-10-CM

## 2018-02-04 LAB — POCT URINALYSIS DIPSTICK
Blood, UA: NEGATIVE
Glucose, UA: NEGATIVE
KETONES UA: NEGATIVE
LEUKOCYTES UA: NEGATIVE
Nitrite, UA: NEGATIVE
PROTEIN UA: NEGATIVE

## 2018-02-04 NOTE — Progress Notes (Signed)
   HIGH-RISK PREGNANCY VISIT Patient name: [name] Polimeni MRN [id number]  Date of birth: 30-Jan-1976 Chief Complaint:   High Risk Gestation (NST)  History of Present Illness:   [name] Vantol is a 42 y.o. 435P3013 female at 5579w2d with an Estimated Date of Delivery: 03/16/18 being seen today for ongoing management of a high-risk pregnancy complicated by chronic HTN, AMA.  Today she reports no complaints. Contractions: Not present. Vag. Bleeding: None.  Movement: Present. denies leaking of fluid.  Review of Systems:   Pertinent items are noted in HPI Denies abnormal vaginal discharge w/ itching/odor/irritation, headaches, visual changes, shortness of breath, chest pain, abdominal pain, severe nausea/vomiting, or problems with urination or bowel movements unless otherwise stated above. Pertinent History Reviewed:  Reviewed past medical,surgical, social, obstetrical and family history.  Reviewed problem list, medications and allergies. Physical Assessment:   Vitals:   02/04/18 1037  BP: 112/70  Pulse: 86  Weight: 177 lb (80.3 kg)  Body mass index is 29.45 kg/m.           Physical Examination:   General appearance: alert, well appearing, and in no distress  Mental status: alert, oriented to person, place, and time  Skin: warm & dry   Extremities: Edema: Trace    Cardiovascular: normal heart rate noted  Respiratory: normal respiratory effort, no distress  Abdomen: gravid, soft, non-tender  Pelvic: Cervical exam deferred         Fetal Status: Fetal Heart Rate (bpm): 140 Fundal Height: 35 cm Movement: Present    Fetal Surveillance Testing today: Reactive NST   Results for orders placed or performed in visit on 02/04/18 (from the past 24 hour(s))  POCT urinalysis dipstick   Collection Time: 02/04/18 10:38 AM  Result Value Ref Range   Color, UA     Clarity, UA     Glucose, UA Negative Negative   Bilirubin, UA     Ketones, UA neg    Spec Grav, UA  1.010 - 1.025   Blood, UA neg    pH,  UA  5.0 - 8.0   Protein, UA Negative Negative   Urobilinogen, UA  0.2 or 1.0 E.U./dL   Nitrite, UA neg    Leukocytes, UA Negative Negative   Appearance     Odor      Assessment & Plan:  1) High-risk pregnancy [id number] at 5979w2d with an Estimated Date of Delivery: 03/16/18   2) CHTN, no meds, stable  3) AMA, stable  Meds: No orders of the defined types were placed in this encounter.   Labs/procedures today: Reactive NST  Treatment Plan:  Twice weekly surveillance, EFW 36 weeks, IOL 39-40 depending on BP and clinical course  Reviewed: Preterm labor symptoms and general obstetric precautions including but not limited to vaginal bleeding, contractions, leaking of fluid and fetal movement were reviewed in detail with the patient.  All questions were answered.  Follow-up: Return in about 4 days (around 02/08/2018) for NST, HROB.  Orders Placed This Encounter  Procedures  . POCT urinalysis dipstick   Lazaro ArmsLuther H Eure  02/04/2018 11:16 AM

## 2018-02-08 ENCOUNTER — Encounter: Payer: Self-pay | Admitting: Obstetrics & Gynecology

## 2018-02-08 ENCOUNTER — Ambulatory Visit (INDEPENDENT_AMBULATORY_CARE_PROVIDER_SITE_OTHER): Payer: Medicaid Other | Admitting: Obstetrics & Gynecology

## 2018-02-08 VITALS — BP 105/65 | HR 76 | Wt 178.3 lb

## 2018-02-08 DIAGNOSIS — Z3A34 34 weeks gestation of pregnancy: Secondary | ICD-10-CM

## 2018-02-08 DIAGNOSIS — O0993 Supervision of high risk pregnancy, unspecified, third trimester: Secondary | ICD-10-CM

## 2018-02-08 DIAGNOSIS — O10919 Unspecified pre-existing hypertension complicating pregnancy, unspecified trimester: Secondary | ICD-10-CM | POA: Diagnosis not present

## 2018-02-08 DIAGNOSIS — O09523 Supervision of elderly multigravida, third trimester: Secondary | ICD-10-CM | POA: Diagnosis not present

## 2018-02-08 DIAGNOSIS — Z1389 Encounter for screening for other disorder: Secondary | ICD-10-CM

## 2018-02-08 DIAGNOSIS — Z331 Pregnant state, incidental: Secondary | ICD-10-CM

## 2018-02-08 LAB — POCT URINALYSIS DIPSTICK
Glucose, UA: NEGATIVE
Ketones, UA: NEGATIVE
Leukocytes, UA: NEGATIVE
Nitrite, UA: NEGATIVE
Protein, UA: NEGATIVE
RBC UA: NEGATIVE

## 2018-02-08 NOTE — Progress Notes (Signed)
   HIGH-RISK PREGNANCY VISIT Patient name: Yvonne ChoughLamanya Dinsmore MRN [id number]  Date of birth: Jul 26, 1976 Chief Complaint:   high risk ob (NST)  History of Present Illness:   [name] is a 42 y.o. [phone] female at 5768w6d with an Estimated Date of Delivery: 03/16/18 being seen today for ongoing management of a high-risk pregnancy complicated by chronic HTN, AMA.  Today she reports contractions since in the evenings. Contractions: Irregular.  .  Movement: Present. denies leaking of fluid.  Review of Systems:   Pertinent items are noted in HPI Denies abnormal vaginal discharge w/ itching/odor/irritation, headaches, visual changes, shortness of breath, chest pain, abdominal pain, severe nausea/vomiting, or problems with urination or bowel movements unless otherwise stated above. Pertinent History Reviewed:  Reviewed past medical,surgical, social, obstetrical and family history.  Reviewed problem list, medications and allergies. Physical Assessment:   Vitals:   02/08/18 1021  BP: 105/65  Pulse: 76  Weight: 178 lb 4.8 oz (80.9 kg)  Body mass index is 29.67 kg/m.           Physical Examination:   General appearance: alert, well appearing, and in no distress  Mental status: alert, oriented to person, place, and time  Skin: warm & dry   Extremities: Edema: Trace    Cardiovascular: normal heart rate noted  Respiratory: normal respiratory effort, no distress  Abdomen: gravid, soft, non-tender  Pelvic: Cervical exam deferred         Fetal Status: Fetal Heart Rate (bpm): 145 Fundal Height: 35 cm Movement: Present    Fetal Surveillance Testing today: Reactive NST   Results for orders placed or performed in visit on 02/08/18 (from the past 24 hour(s))  POCT urinalysis dipstick   Collection Time: 02/08/18 10:26 AM  Result Value Ref Range   Color, UA     Clarity, UA     Glucose, UA Negative Negative   Bilirubin, UA     Ketones, UA neg    Spec Grav, UA  1.010 - 1.025   Blood, UA neg    pH,  UA  5.0 - 8.0   Protein, UA Negative Negative   Urobilinogen, UA  0.2 or 1.0 E.U./dL   Nitrite, UA neg    Leukocytes, UA Negative Negative   Appearance     Odor      Assessment & Plan:  1) High-risk pregnancy [id number] at 4068w6d with an Estimated Date of Delivery: 03/16/18   2) CHTN, , stable, no meds, ASA 81 mg  3) AMA, stable  Meds: No orders of the defined types were placed in this encounter.   Labs/procedures today: Reactive NST  Treatment Plan:  Twice weekly surveillance, IOL 39 weeks  Reviewed: Preterm labor symptoms and general obstetric precautions including but not limited to vaginal bleeding, contractions, leaking of fluid and fetal movement were reviewed in detail with the patient.  All questions were answered.  Follow-up: Return in about 3 days (around 02/11/2018) for NST, HROB.  Orders Placed This Encounter  Procedures  . POCT urinalysis dipstick   Lazaro ArmsLuther H Adib Wahba  02/08/2018 11:06 AM

## 2018-02-11 ENCOUNTER — Ambulatory Visit (INDEPENDENT_AMBULATORY_CARE_PROVIDER_SITE_OTHER): Payer: Medicaid Other

## 2018-02-11 DIAGNOSIS — O10919 Unspecified pre-existing hypertension complicating pregnancy, unspecified trimester: Secondary | ICD-10-CM

## 2018-02-11 DIAGNOSIS — Z3A35 35 weeks gestation of pregnancy: Secondary | ICD-10-CM | POA: Diagnosis not present

## 2018-02-11 DIAGNOSIS — O09523 Supervision of elderly multigravida, third trimester: Secondary | ICD-10-CM

## 2018-02-11 DIAGNOSIS — O10913 Unspecified pre-existing hypertension complicating pregnancy, third trimester: Secondary | ICD-10-CM | POA: Diagnosis not present

## 2018-02-11 DIAGNOSIS — O0990 Supervision of high risk pregnancy, unspecified, unspecified trimester: Secondary | ICD-10-CM

## 2018-02-11 DIAGNOSIS — O0993 Supervision of high risk pregnancy, unspecified, third trimester: Secondary | ICD-10-CM

## 2018-02-11 NOTE — Progress Notes (Signed)
US 35+2 wks,cephalic,fhr 135 bpm,afi 11 cm,posterior pl gr 2 w/anterior accessory lobe,RI .58,.60=55%,EFW 2564 g 40%,BPP 8/8

## 2018-02-12 ENCOUNTER — Encounter: Payer: Self-pay | Admitting: Obstetrics and Gynecology

## 2018-02-12 ENCOUNTER — Ambulatory Visit (INDEPENDENT_AMBULATORY_CARE_PROVIDER_SITE_OTHER): Payer: Medicaid Other | Admitting: Obstetrics and Gynecology

## 2018-02-12 VITALS — BP 112/72 | HR 92 | Wt 177.4 lb

## 2018-02-12 DIAGNOSIS — Z3A35 35 weeks gestation of pregnancy: Secondary | ICD-10-CM

## 2018-02-12 DIAGNOSIS — O09523 Supervision of elderly multigravida, third trimester: Secondary | ICD-10-CM

## 2018-02-12 DIAGNOSIS — Z1389 Encounter for screening for other disorder: Secondary | ICD-10-CM

## 2018-02-12 DIAGNOSIS — O10913 Unspecified pre-existing hypertension complicating pregnancy, third trimester: Secondary | ICD-10-CM

## 2018-02-12 DIAGNOSIS — O0990 Supervision of high risk pregnancy, unspecified, unspecified trimester: Secondary | ICD-10-CM

## 2018-02-12 DIAGNOSIS — Z331 Pregnant state, incidental: Secondary | ICD-10-CM

## 2018-02-12 LAB — POCT URINALYSIS DIPSTICK
Blood, UA: NEGATIVE
Glucose, UA: NEGATIVE
Ketones, UA: NEGATIVE
Nitrite, UA: NEGATIVE
PROTEIN UA: NEGATIVE

## 2018-02-12 NOTE — Progress Notes (Signed)
Patient ID: [name] Horacek, female   DOB: 08-09-1976, 42 y.o.   MRN: [id number]    [facility] Patient name: [name] Stovall MRN [id number]  Date of birth: 08-09-1976 Chief Complaint:   High Risk Gestation (+ contractions)  History of Present Illness:   [name] Quilling is a 42 y.o. 735P3013 female at 7055w3d with an Estimated Date of Delivery: 03/16/18 being seen today for ongoing management of a high-risk pregnancy complicated by chronic HTN and AMA.  She was seen on 02/08/2018 and said she had been having irregular contractions the night before.she was walking at 2 am due to her contractions. Today she reports irregular contractions. She almost went to the hospital 2 days ago, 02/10/2018, because of her contractions. She plans on getting her tubes tied after this child.   Contractions: Irregular. Vag. Bleeding: None.  Movement: Present. denies leaking of fluid.  Review of Systems:   Pertinent items are noted in HPI Denies abnormal vaginal discharge w/ itching/odor/irritation, headaches, visual changes, shortness of breath, chest pain, abdominal pain, severe nausea/vomiting, or problems with urination or bowel movements unless otherwise stated above. Pertinent History Reviewed:  Reviewed past medical,surgical, social, obstetrical and family history.  Reviewed problem list, medications and allergies. Physical Assessment:   Vitals:   02/12/18 1039  BP: 112/72  Pulse: 92  Weight: 177 lb 6.4 oz (80.5 kg)  Body mass index is 29.52 kg/m.           Physical Examination:   General appearance: alert, well appearing, and in no distress and oriented to person, place, and time  Mental status: alert, oriented to person, place, and time, normal mood, behavior, speech, dress, motor activity, and thought processes, affect appropriate to mood  Skin: warm & dry   Extremities: Edema: Trace    Cardiovascular: normal heart rate noted  Respiratory: normal respiratory effort, no distress  Abdomen:  gravid, soft, non-tender  Pelvic: Cervical exam performed   Cervix: closed, long, posterior -2 vertex       Fetal Status:     Movement: Present    Fetal Surveillance Testing today: none had testing yesterday  Results for orders placed or performed in visit on 02/12/18 (from the past 24 hour(s))  POCT urinalysis dipstick   Collection Time: 02/12/18 10:41 AM  Result Value Ref Range   Color, UA     Clarity, UA     Glucose, UA Negative Negative   Bilirubin, UA     Ketones, UA neg    Spec Grav, UA  1.010 - 1.025   Blood, UA neg    pH, UA  5.0 - 8.0   Protein, UA Negative Negative   Urobilinogen, UA  0.2 or 1.0 E.U./dL   Nitrite, UA neg    Leukocytes, UA Trace (A) Negative   Appearance     Odor      Assessment & Plan:  1) High-risk pregnancy [id number] at 2055w3d with an Estimated Date of Delivery: 03/16/18   2) chronic HTN , stable  3) AMA  Meds: No orders of the defined types were placed in this encounter.  Labs/procedures today: none  Treatment Plan:  1) F/U 02/16/2018 for NST  Follow-up: Return in about 4 days (around 02/16/2018) for HROB.  Orders Placed This Encounter  Procedures  . POCT urinalysis dipstick   By signing my name below, I, Pietro CassisEmily Tufford, attest that this documentation has been prepared under the direction and in the presence of Tilda BurrowFerguson, Lular Letson V, MD. Electronically Signed: Pietro CassisEmily Tufford, Medical Scribe.  02/12/18. 10:46 AM.  I personally performed the services described in this documentation, which was SCRIBED in my presence. The recorded information has been reviewed and considered accurate. It has been edited as necessary during review. Tilda Burrow, MD

## 2018-02-15 ENCOUNTER — Other Ambulatory Visit: Payer: Medicaid Other | Admitting: Obstetrics & Gynecology

## 2018-02-16 ENCOUNTER — Encounter: Payer: Self-pay | Admitting: Obstetrics & Gynecology

## 2018-02-16 ENCOUNTER — Ambulatory Visit (INDEPENDENT_AMBULATORY_CARE_PROVIDER_SITE_OTHER): Payer: Medicaid Other | Admitting: Obstetrics & Gynecology

## 2018-02-16 VITALS — BP 122/73 | HR 76 | Wt 177.5 lb

## 2018-02-16 DIAGNOSIS — Z331 Pregnant state, incidental: Secondary | ICD-10-CM

## 2018-02-16 DIAGNOSIS — O0990 Supervision of high risk pregnancy, unspecified, unspecified trimester: Secondary | ICD-10-CM

## 2018-02-16 DIAGNOSIS — O10919 Unspecified pre-existing hypertension complicating pregnancy, unspecified trimester: Secondary | ICD-10-CM | POA: Diagnosis not present

## 2018-02-16 DIAGNOSIS — Z3A36 36 weeks gestation of pregnancy: Secondary | ICD-10-CM | POA: Diagnosis not present

## 2018-02-16 DIAGNOSIS — O09523 Supervision of elderly multigravida, third trimester: Secondary | ICD-10-CM

## 2018-02-16 DIAGNOSIS — Z1389 Encounter for screening for other disorder: Secondary | ICD-10-CM

## 2018-02-16 LAB — POCT URINALYSIS DIPSTICK
Blood, UA: NEGATIVE
Glucose, UA: NEGATIVE
Ketones, UA: NEGATIVE
Nitrite, UA: NEGATIVE
Protein, UA: NEGATIVE

## 2018-02-16 NOTE — Progress Notes (Signed)
   HIGH-RISK PREGNANCY VISIT Patient name: [name] MRN [id number]  Date of birth: 10-03-1975 Chief Complaint:   High Risk Gestation (NST)  History of Present Illness:   Yvonne ChoughLamanya Polan is a 42 y.o. 295P3013 female at 283w0d with an Estimated Date of Delivery: 03/16/18 being seen today for ongoing management of a high-risk pregnancy complicated by chronic HTN, AMA. No meds Today she reports no complaints. Contractions: Regular. Vag. Bleeding: None.  Movement: Present. denies leaking of fluid.  Review of Systems:   Pertinent items are noted in HPI Denies abnormal vaginal discharge w/ itching/odor/irritation, headaches, visual changes, shortness of breath, chest pain, abdominal pain, severe nausea/vomiting, or problems with urination or bowel movements unless otherwise stated above. Pertinent History Reviewed:  Reviewed past medical,surgical, social, obstetrical and family history.  Reviewed problem list, medications and allergies. Physical Assessment:   Vitals:   02/16/18 1056  BP: 122/73  Pulse: 76  Weight: 177 lb 8 oz (80.5 kg)  Body mass index is 29.54 kg/m.           Physical Examination:   General appearance: alert, well appearing, and in no distress  Mental status: alert, oriented to person, place, and time  Skin: warm & dry   Extremities: Edema: Trace    Cardiovascular: normal heart rate noted  Respiratory: normal respiratory effort, no distress  Abdomen: gravid, soft, non-tender  Pelvic: Cervical exam deferred         Fetal Status:     Movement: Present    Fetal Surveillance Testing today: reactive NST   Results for orders placed or performed in visit on 02/16/18 (from the past 24 hour(s))  POCT urinalysis dipstick   Collection Time: 02/16/18 10:58 AM  Result Value Ref Range   Color, UA     Clarity, UA     Glucose, UA Negative Negative   Bilirubin, UA     Ketones, UA neg    Spec Grav, UA  1.010 - 1.025   Blood, UA neg    pH, UA  5.0 - 8.0   Protein, UA Negative  Negative   Urobilinogen, UA  0.2 or 1.0 E.U./dL   Nitrite, UA neg    Leukocytes, UA Trace (A) Negative   Appearance     Odor      Assessment & Plan:  1) High-risk pregnancy [id number] at 613w0d with an Estimated Date of Delivery: 03/16/18   2) CHTN, stable, no meds, continue twice weekly NST, EFW 40%, IOL 39 weeks  3) AMA, stable  Meds: No orders of the defined types were placed in this encounter.   Labs/procedures today: reactive NST  Treatment Plan:  Twice weekly NST IOL 39 weeks  Reviewed: Term labor symptoms and general obstetric precautions including but not limited to vaginal bleeding, contractions, leaking of fluid and fetal movement were reviewed in detail with the patient.  All questions were answered.  Follow-up: Return in about 3 days (around 02/19/2018) for NST, HROB.  Orders Placed This Encounter  Procedures  . POCT urinalysis dipstick   Lazaro ArmsLuther H Jerricka Carvey MD 02/16/2018 11:35 AM

## 2018-02-19 ENCOUNTER — Ambulatory Visit (INDEPENDENT_AMBULATORY_CARE_PROVIDER_SITE_OTHER): Payer: Medicaid Other

## 2018-02-19 ENCOUNTER — Ambulatory Visit (INDEPENDENT_AMBULATORY_CARE_PROVIDER_SITE_OTHER): Payer: Medicaid Other | Admitting: Obstetrics & Gynecology

## 2018-02-19 ENCOUNTER — Encounter: Payer: Self-pay | Admitting: Obstetrics & Gynecology

## 2018-02-19 VITALS — BP 113/75 | HR 77 | Wt 179.0 lb

## 2018-02-19 DIAGNOSIS — O09523 Supervision of elderly multigravida, third trimester: Secondary | ICD-10-CM | POA: Diagnosis not present

## 2018-02-19 DIAGNOSIS — O10919 Unspecified pre-existing hypertension complicating pregnancy, unspecified trimester: Secondary | ICD-10-CM | POA: Diagnosis not present

## 2018-02-19 DIAGNOSIS — Z3A36 36 weeks gestation of pregnancy: Secondary | ICD-10-CM

## 2018-02-19 DIAGNOSIS — Z331 Pregnant state, incidental: Secondary | ICD-10-CM

## 2018-02-19 DIAGNOSIS — O0993 Supervision of high risk pregnancy, unspecified, third trimester: Secondary | ICD-10-CM

## 2018-02-19 DIAGNOSIS — Z1389 Encounter for screening for other disorder: Secondary | ICD-10-CM

## 2018-02-19 DIAGNOSIS — O10913 Unspecified pre-existing hypertension complicating pregnancy, third trimester: Secondary | ICD-10-CM

## 2018-02-19 DIAGNOSIS — O0990 Supervision of high risk pregnancy, unspecified, unspecified trimester: Secondary | ICD-10-CM

## 2018-02-19 LAB — POCT URINALYSIS DIPSTICK
Blood, UA: NEGATIVE
Glucose, UA: NEGATIVE
Ketones, UA: NEGATIVE
NITRITE UA: NEGATIVE
PROTEIN UA: NEGATIVE

## 2018-02-19 NOTE — Progress Notes (Addendum)
US 36+3 wks,cephalic,BPP 8/8,posterior pl gr 3 w/anterior accessory lobe,AFI 12 CM,FHR 138 bpm,RI .63,.65,.66,.67=89%

## 2018-02-19 NOTE — Progress Notes (Signed)
   HIGH-RISK PREGNANCY VISIT Patient name: [name] MRN [id number]  Date of birth: 11/24/1975 Chief Complaint:   High Risk Gestation (US today)  History of Present Illness:   [name] is a 42 y.o. [id number] female at 5834w3d with an Estimated Date of Delivery: 03/16/18 being seen today for ongoing management of a high-risk pregnancy complicated by chronic HTN, AMA.  Today she reports no complaints. Contractions: Regular. Vag. Bleeding: None.  Movement: Present. denies leaking of fluid.  Review of Systems:   Pertinent items are noted in HPI Denies abnormal vaginal discharge w/ itching/odor/irritation, headaches, visual changes, shortness of breath, chest pain, abdominal pain, severe nausea/vomiting, or problems with urination or bowel movements unless otherwise stated above. Pertinent History Reviewed:  Reviewed past medical,surgical, social, obstetrical and family history.  Reviewed problem list, medications and allergies. Physical Assessment:   Vitals:   02/19/18 1119  BP: 113/75  Pulse: 77  Weight: 179 lb (81.2 kg)  Body mass index is 29.79 kg/m.           Physical Examination:   General appearance: alert, well appearing, and in no distress  Mental status: alert, oriented to person, place, and time  Skin: warm & dry   Extremities: Edema: Trace    Cardiovascular: normal heart rate noted  Respiratory: normal respiratory effort, no distress  Abdomen: gravid, soft, non-tender  Pelvic: Cervical exam deferred         Fetal Status:     Movement: Present    Fetal Surveillance Testing today: BPP 8/8   Results for orders placed or performed in visit on 02/19/18 (from the past 24 hour(s))  POCT urinalysis dipstick   Collection Time: 02/19/18 11:21 AM  Result Value Ref Range   Color, UA     Clarity, UA     Glucose, UA Negative Negative   Bilirubin, UA     Ketones, UA neg    Spec Grav, UA  1.010 - 1.025   Blood, UA neg    pH, UA  5.0 - 8.0   Protein, UA Negative Negative   Urobilinogen, UA  0.2 or 1.0 E.U./dL   Nitrite, UA neg    Leukocytes, UA Small (1+) (A) Negative   Appearance     Odor      Assessment & Plan:  1) High-risk pregnancy [id number] at 7934w3d with an Estimated Date of Delivery: 03/16/18   2) CHTN, stable, no meds  3) AMA, stable  Meds: No orders of the defined types were placed in this encounter.   Labs/procedures today: B8 with normal Doppler studies  Treatment Plan:  Twice weekly surveillance, sonogram alternating with NST, induction at 39 weeks or as clinically indicated   Reviewed: Term labor symptoms and general obstetric precautions including but not limited to vaginal bleeding, contractions, leaking of fluid and fetal movement were reviewed in detail with the patient.  All questions were answered.  Follow-up: Return in about 4 days (around 02/23/2018) for NST, HROB.  Orders Placed This Encounter  Procedures  . POCT urinalysis dipstick   Lazaro ArmsLuther H Amand Lemoine  02/19/2018 11:49 AM

## 2018-02-22 ENCOUNTER — Other Ambulatory Visit: Payer: Self-pay

## 2018-02-22 ENCOUNTER — Ambulatory Visit (INDEPENDENT_AMBULATORY_CARE_PROVIDER_SITE_OTHER): Payer: Medicaid Other | Admitting: Obstetrics and Gynecology

## 2018-02-22 ENCOUNTER — Encounter: Payer: Self-pay | Admitting: Obstetrics and Gynecology

## 2018-02-22 VITALS — BP 129/79 | HR 77 | Wt 179.0 lb

## 2018-02-22 DIAGNOSIS — O09523 Supervision of elderly multigravida, third trimester: Secondary | ICD-10-CM

## 2018-02-22 DIAGNOSIS — O10913 Unspecified pre-existing hypertension complicating pregnancy, third trimester: Secondary | ICD-10-CM | POA: Diagnosis not present

## 2018-02-22 DIAGNOSIS — Z3A36 36 weeks gestation of pregnancy: Secondary | ICD-10-CM

## 2018-02-22 DIAGNOSIS — Z1389 Encounter for screening for other disorder: Secondary | ICD-10-CM

## 2018-02-22 DIAGNOSIS — Z3483 Encounter for supervision of other normal pregnancy, third trimester: Secondary | ICD-10-CM

## 2018-02-22 DIAGNOSIS — O0990 Supervision of high risk pregnancy, unspecified, unspecified trimester: Secondary | ICD-10-CM

## 2018-02-22 DIAGNOSIS — Z331 Pregnant state, incidental: Secondary | ICD-10-CM

## 2018-02-22 LAB — POCT URINALYSIS DIPSTICK
GLUCOSE UA: NEGATIVE
Ketones, UA: NEGATIVE
LEUKOCYTES UA: NEGATIVE
Nitrite, UA: NEGATIVE
Protein, UA: NEGATIVE
RBC UA: NEGATIVE

## 2018-02-22 NOTE — Progress Notes (Signed)
Patient ID: [name], female   DOB: 02/19/1976, 42 y.o.   MRN: [id number]    [facility] Patient name: Yvonne ChoughLamanya Schafer MRN [id number]  Date of birth: 02/19/1976 Chief Complaint:   High Risk Gestation (NST; GC/CHL)  History of Present Illness:   Yvonne ChoughLamanya Trias is a 42 y.o. 825P3013 female at 2743w6d with an Estimated Date of Delivery: 03/16/18 being seen today for ongoing management of a high-risk pregnancy complicated by chronic HTN, AMA.  Today she reports contractions since 4:45 am.  for 1 hour 5 to 7 minutes apart then contractions slowed to 7-10 minutes apart. She notes pressure in her butt. Denies spotting on bleeding  Contractions: Regular. Vag. Bleeding: None.  Movement: Present. denies leaking of fluid.  Review of Systems:   Pertinent items are noted in HPI Denies abnormal vaginal discharge w/ itching/odor/irritation, headaches, visual changes, shortness of breath, chest pain, abdominal pain, severe nausea/vomiting, or problems with urination or bowel movements unless otherwise stated above. Pertinent History Reviewed:  Reviewed past medical,surgical, social, obstetrical and family history.  Reviewed problem list, medications and allergies. Physical Assessment:   Vitals:   02/22/18 1038  BP: 129/79  Pulse: 77  Weight: 179 lb (81.2 kg)  Body mass index is 29.79 kg/m.           Physical Examination:   General appearance: alert, well appearing, and in no distress, oriented to person, place, and time and a bit tired  Mental status: alert, oriented to person, place, and time, normal mood, behavior, speech, dress, motor activity, and thought processes, affect appropriate to mood  Skin: warm & dry   Extremities: Edema: Trace    Cardiovascular: normal heart rate noted  Respiratory: normal respiratory effort, no distress  Abdomen: gravid, soft, non-tender  Pelvic: Cervical exam performed, cervix firm closed minus 3  Rectal exam: GBS collected           Fetal Status:      Movement: Present    Fetal Surveillance Testing today: NST   Results for orders placed or performed in visit on 02/22/18 (from the past 24 hour(s))  POCT urinalysis dipstick   Collection Time: 02/22/18 10:40 AM  Result Value Ref Range   Color, UA     Clarity, UA     Glucose, UA Negative Negative   Bilirubin, UA     Ketones, UA neg    Spec Grav, UA  1.010 - 1.025   Blood, UA neg    pH, UA  5.0 - 8.0   Protein, UA Negative Negative   Urobilinogen, UA  0.2 or 1.0 E.U./dL   Nitrite, UA neg    Leukocytes, UA Negative Negative   Appearance     Odor      Assessment & Plan:  1) High-risk pregnancy [id number] at 7743w6d with an Estimated Date of Delivery: 03/16/18   2) CHTN, stable  3) AMA, stable  Meds: No orders of the defined types were placed in this encounter.   Labs/procedures today: NST reactive  Treatment Plan:  Continue routine obstetrical care  Follow-up: No follow-ups on file.  Orders Placed This Encounter  Procedures  . Culture, beta strep (group b only)  . GC/Chlamydia Probe Amp  . POCT urinalysis dipstick   By signing my name below, I, Arnette NorrisMari Johnson, attest that this documentation has been prepared under the direction and in the presence of Tilda BurrowFerguson, Shanitha Twining V, MD Electronically Signed: Arnette NorrisMari Johnson Medical Scribe. 02/22/18. 10:53 AM.

## 2018-02-24 ENCOUNTER — Encounter (HOSPITAL_COMMUNITY): Payer: Self-pay | Admitting: Emergency Medicine

## 2018-02-24 ENCOUNTER — Encounter (HOSPITAL_COMMUNITY)
Admission: AD | Disposition: A | Discharge status: Home / Self Care | Payer: Self-pay | Source: Ambulatory Visit | Attending: Obstetrics and Gynecology

## 2018-02-24 ENCOUNTER — Inpatient Hospital Stay (HOSPITAL_COMMUNITY): Payer: Medicaid Other | Admitting: Anesthesiology

## 2018-02-24 ENCOUNTER — Inpatient Hospital Stay (HOSPITAL_COMMUNITY)
Admission: AD | Admit: 2018-02-24 | Discharge: 2018-02-26 | DRG: 797 | Disposition: A | Discharge status: Home / Self Care | Payer: Medicaid Other | Source: Ambulatory Visit | Attending: Obstetrics and Gynecology | Admitting: Obstetrics and Gynecology

## 2018-02-24 ENCOUNTER — Other Ambulatory Visit: Payer: Self-pay

## 2018-02-24 DIAGNOSIS — F17210 Nicotine dependence, cigarettes, uncomplicated: Secondary | ICD-10-CM | POA: Diagnosis present

## 2018-02-24 DIAGNOSIS — O1092 Unspecified pre-existing hypertension complicating childbirth: Secondary | ICD-10-CM | POA: Diagnosis present

## 2018-02-24 DIAGNOSIS — O10919 Unspecified pre-existing hypertension complicating pregnancy, unspecified trimester: Secondary | ICD-10-CM | POA: Diagnosis present

## 2018-02-24 DIAGNOSIS — Z302 Encounter for sterilization: Secondary | ICD-10-CM | POA: Diagnosis not present

## 2018-02-24 DIAGNOSIS — O4292 Full-term premature rupture of membranes, unspecified as to length of time between rupture and onset of labor: Secondary | ICD-10-CM | POA: Diagnosis present

## 2018-02-24 DIAGNOSIS — O1002 Pre-existing essential hypertension complicating childbirth: Secondary | ICD-10-CM

## 2018-02-24 DIAGNOSIS — Z3A37 37 weeks gestation of pregnancy: Secondary | ICD-10-CM | POA: Diagnosis not present

## 2018-02-24 DIAGNOSIS — K219 Gastro-esophageal reflux disease without esophagitis: Secondary | ICD-10-CM | POA: Diagnosis present

## 2018-02-24 DIAGNOSIS — O0990 Supervision of high risk pregnancy, unspecified, unspecified trimester: Secondary | ICD-10-CM

## 2018-02-24 DIAGNOSIS — O99334 Smoking (tobacco) complicating childbirth: Secondary | ICD-10-CM | POA: Diagnosis present

## 2018-02-24 DIAGNOSIS — Z8759 Personal history of other complications of pregnancy, childbirth and the puerperium: Secondary | ICD-10-CM

## 2018-02-24 DIAGNOSIS — O4290 Premature rupture of membranes, unspecified as to length of time between rupture and onset of labor, unspecified weeks of gestation: Secondary | ICD-10-CM | POA: Diagnosis present

## 2018-02-24 DIAGNOSIS — O09529 Supervision of elderly multigravida, unspecified trimester: Secondary | ICD-10-CM

## 2018-02-24 DIAGNOSIS — O9962 Diseases of the digestive system complicating childbirth: Secondary | ICD-10-CM | POA: Diagnosis present

## 2018-02-24 DIAGNOSIS — O4202 Full-term premature rupture of membranes, onset of labor within 24 hours of rupture: Secondary | ICD-10-CM

## 2018-02-24 HISTORY — DX: Essential (primary) hypertension: I10

## 2018-02-24 HISTORY — PX: TUBAL LIGATION: SHX77

## 2018-02-24 LAB — CBC
HCT: 35.5 % — ABNORMAL LOW (ref 36.0–46.0)
HCT: 33.1 % — ABNORMAL LOW (ref 36.0–46.0)
HEMOGLOBIN: 12 g/dL (ref 12.0–15.0)
Hemoglobin: 11.2 g/dL — ABNORMAL LOW (ref 12.0–15.0)
MCH: 32 pg (ref 26.0–34.0)
MCH: 32 pg (ref 26.0–34.0)
MCHC: 33.8 g/dL (ref 30.0–36.0)
MCHC: 33.8 g/dL (ref 30.0–36.0)
MCV: 94.7 fL (ref 78.0–100.0)
MCV: 94.6 fL (ref 78.0–100.0)
Platelets: 256 10*3/uL (ref 150–400)
Platelets: 217 10*3/uL (ref 150–400)
RBC: 3.75 MIL/uL — ABNORMAL LOW (ref 3.87–5.11)
RBC: 3.5 MIL/uL — ABNORMAL LOW (ref 3.87–5.11)
RDW: 13.3 % (ref 11.5–15.5)
RDW: 13.3 % (ref 11.5–15.5)
WBC: 9.1 10*3/uL (ref 4.0–10.5)
WBC: 11.1 10*3/uL — AB (ref 4.0–10.5)

## 2018-02-24 LAB — SYPHILIS: RPR W/REFLEX TO RPR TITER AND TREPONEMAL ANTIBODIES, TRADITIONAL SCREENING AND DIAGNOSIS ALGORITHM: RPR Ser Ql: NONREACTIVE

## 2018-02-24 LAB — RAPID URINE DRUG SCREEN, HOSP PERFORMED
Amphetamines: NOT DETECTED
Benzodiazepines: NOT DETECTED
Cocaine: NOT DETECTED
Opiates: NOT DETECTED
Tetrahydrocannabinol: NOT DETECTED

## 2018-02-24 LAB — GC/CHLAMYDIA PROBE AMP
Chlamydia trachomatis, NAA: NEGATIVE
Neisseria gonorrhoeae by PCR: NEGATIVE

## 2018-02-24 LAB — OB RESULTS CONSOLE GBS: STREP GROUP B AG: NEGATIVE

## 2018-02-24 LAB — GROUP B STREP BY PCR: Group B strep by PCR: NEGATIVE

## 2018-02-24 SURGERY — LIGATION, FALLOPIAN TUBE, POSTPARTUM
Anesthesia: Epidural | Laterality: Bilateral

## 2018-02-24 MED ORDER — SOD CITRATE-CITRIC ACID 500-334 MG/5ML PO SOLN
30.0000 mL | ORAL | Status: DC | PRN
  Administered 2018-02-24: 30 mL via ORAL
  Filled 2018-02-24: qty 15

## 2018-02-24 MED ORDER — ACETAMINOPHEN 325 MG PO TABS
650.0000 mg | ORAL_TABLET | ORAL | Status: DC | PRN
  Administered 2018-02-24 – 2018-02-26 (×4): 650 mg via ORAL
  Filled 2018-02-24 (×5): qty 2

## 2018-02-24 MED ORDER — LACTATED RINGERS IV SOLN: 500.0000 mL | INTRAVENOUS | Status: DC | PRN

## 2018-02-24 MED ORDER — MIDAZOLAM HCL 2 MG/2ML IJ SOLN
INTRAMUSCULAR | Status: AC
  Filled 2018-02-24: qty 2

## 2018-02-24 MED ORDER — OXYTOCIN BOLUS FROM INFUSION
500.0000 mL | Freq: Once | INTRAVENOUS | Status: AC
  Administered 2018-02-24: 500 mL via INTRAVENOUS

## 2018-02-24 MED ORDER — BENZOCAINE-MENTHOL 20-0.5 % EX AERO: 1.0000 "application " | INHALATION_SPRAY | CUTANEOUS | Status: DC | PRN

## 2018-02-24 MED ORDER — TERBUTALINE SULFATE 1 MG/ML IJ SOLN: 0.2500 mg | Freq: Once | INTRAMUSCULAR | Status: DC | PRN

## 2018-02-24 MED ORDER — FENTANYL CITRATE (PF) 100 MCG/2ML IJ SOLN: 100.0000 ug | INTRAMUSCULAR | Status: DC | PRN

## 2018-02-24 MED ORDER — COCONUT OIL OIL: 1.0000 "application " | TOPICAL_OIL | Status: DC | PRN

## 2018-02-24 MED ORDER — MEPERIDINE HCL 25 MG/ML IJ SOLN
INTRAMUSCULAR | Status: AC
  Filled 2018-02-24: qty 1

## 2018-02-24 MED ORDER — EPHEDRINE 5 MG/ML INJ: 10.0000 mg | INTRAVENOUS | Status: DC | PRN

## 2018-02-24 MED ORDER — PRENATAL MULTIVITAMIN CH
1.0000 | ORAL_TABLET | Freq: Every day | ORAL | Status: DC
  Administered 2018-02-25: 1 via ORAL
  Filled 2018-02-24: qty 1

## 2018-02-24 MED ORDER — ONDANSETRON HCL 4 MG/2ML IJ SOLN
4.0000 mg | Freq: Four times a day (QID) | INTRAMUSCULAR | Status: DC | PRN
  Administered 2018-02-24: 4 mg via INTRAVENOUS

## 2018-02-24 MED ORDER — LACTATED RINGERS IV SOLN
INTRAVENOUS | Status: DC
  Administered 2018-02-24 (×2): via INTRAVENOUS

## 2018-02-24 MED ORDER — FENTANYL 2.5 MCG/ML BUPIVACAINE 1/10 % EPIDURAL INFUSION (WH - ANES)
14.0000 mL/h | INTRAMUSCULAR | Status: DC | PRN
  Administered 2018-02-24: 14 mL/h via EPIDURAL
  Filled 2018-02-24: qty 100

## 2018-02-24 MED ORDER — ONDANSETRON HCL 4 MG/2ML IJ SOLN: 4.0000 mg | INTRAMUSCULAR | Status: DC | PRN

## 2018-02-24 MED ORDER — OXYCODONE-ACETAMINOPHEN 5-325 MG PO TABS: 2.0000 | ORAL_TABLET | ORAL | Status: DC | PRN

## 2018-02-24 MED ORDER — LIDOCAINE-EPINEPHRINE (PF) 2 %-1:200000 IJ SOLN
INTRAMUSCULAR | Status: AC
  Filled 2018-02-24: qty 20

## 2018-02-24 MED ORDER — SODIUM BICARBONATE 8.4 % IV SOLN
INTRAVENOUS | Status: DC | PRN
  Administered 2018-02-24: 10 mL via EPIDURAL
  Administered 2018-02-24: 4 mL via EPIDURAL

## 2018-02-24 MED ORDER — LIDOCAINE HCL (PF) 1 % IJ SOLN
30.0000 mL | INTRAMUSCULAR | Status: DC | PRN
  Filled 2018-02-24: qty 30

## 2018-02-24 MED ORDER — IBUPROFEN 600 MG PO TABS
600.0000 mg | ORAL_TABLET | Freq: Four times a day (QID) | ORAL | Status: DC
  Administered 2018-02-25 – 2018-02-26 (×5): 600 mg via ORAL
  Filled 2018-02-24 (×5): qty 1

## 2018-02-24 MED ORDER — PHENYLEPHRINE 40 MCG/ML (10ML) SYRINGE FOR IV PUSH (FOR BLOOD PRESSURE SUPPORT): 80.0000 ug | PREFILLED_SYRINGE | INTRAVENOUS | Status: DC | PRN

## 2018-02-24 MED ORDER — OXYTOCIN 40 UNITS IN LACTATED RINGERS INFUSION - SIMPLE MED
2.5000 [IU]/h | INTRAVENOUS | Status: DC
  Filled 2018-02-24: qty 1000

## 2018-02-24 MED ORDER — LACTATED RINGERS IV SOLN
500.0000 mL | Freq: Once | INTRAVENOUS | Status: AC
  Administered 2018-02-24: 500 mL via INTRAVENOUS

## 2018-02-24 MED ORDER — OXYTOCIN 40 UNITS IN LACTATED RINGERS INFUSION - SIMPLE MED: 1.0000 m[IU]/min | INTRAVENOUS | Status: DC

## 2018-02-24 MED ORDER — MISOPROSTOL 50MCG HALF TABLET
50.0000 ug | ORAL_TABLET | ORAL | Status: DC | PRN
  Administered 2018-02-24: 50 ug via BUCCAL
  Filled 2018-02-24: qty 1

## 2018-02-24 MED ORDER — SODIUM BICARBONATE 8.4 % IV SOLN
INTRAVENOUS | Status: AC
  Filled 2018-02-24: qty 50

## 2018-02-24 MED ORDER — LACTATED RINGERS IV SOLN: 500.0000 mL | Freq: Once | INTRAVENOUS | Status: DC

## 2018-02-24 MED ORDER — KETOROLAC TROMETHAMINE 30 MG/ML IJ SOLN
30.0000 mg | Freq: Four times a day (QID) | INTRAMUSCULAR | Status: AC
  Administered 2018-02-24 (×2): 30 mg via INTRAVENOUS
  Filled 2018-02-24 (×2): qty 1

## 2018-02-24 MED ORDER — DEXAMETHASONE SODIUM PHOSPHATE 4 MG/ML IJ SOLN
INTRAMUSCULAR | Status: DC | PRN
  Administered 2018-02-24: 4 mg via INTRAVENOUS

## 2018-02-24 MED ORDER — SIMETHICONE 80 MG PO CHEW: 80.0000 mg | CHEWABLE_TABLET | ORAL | Status: DC | PRN

## 2018-02-24 MED ORDER — BUPIVACAINE HCL (PF) 0.25 % IJ SOLN
INTRAMUSCULAR | Status: AC
  Filled 2018-02-24: qty 30

## 2018-02-24 MED ORDER — KETOROLAC TROMETHAMINE 30 MG/ML IJ SOLN
INTRAMUSCULAR | Status: DC | PRN
  Administered 2018-02-24: 30 mg via INTRAVENOUS

## 2018-02-24 MED ORDER — PHENYLEPHRINE 40 MCG/ML (10ML) SYRINGE FOR IV PUSH (FOR BLOOD PRESSURE SUPPORT)
80.0000 ug | PREFILLED_SYRINGE | INTRAVENOUS | Status: DC | PRN
  Filled 2018-02-24: qty 10

## 2018-02-24 MED ORDER — BUPIVACAINE HCL 0.25 % IJ SOLN
INTRAMUSCULAR | Status: DC | PRN
  Administered 2018-02-24: 30 mL

## 2018-02-24 MED ORDER — KETOROLAC TROMETHAMINE 30 MG/ML IJ SOLN
INTRAMUSCULAR | Status: AC
  Filled 2018-02-24: qty 1

## 2018-02-24 MED ORDER — FENTANYL CITRATE (PF) 100 MCG/2ML IJ SOLN
INTRAMUSCULAR | Status: DC | PRN
  Administered 2018-02-24: 50 ug via EPIDURAL
  Administered 2018-02-24: 50 ug via INTRAVENOUS

## 2018-02-24 MED ORDER — TETANUS-DIPHTH-ACELL PERTUSSIS 5-2.5-18.5 LF-MCG/0.5 IM SUSP: 0.5000 mL | Freq: Once | INTRAMUSCULAR | Status: DC

## 2018-02-24 MED ORDER — WITCH HAZEL-GLYCERIN EX PADS: 1.0000 "application " | MEDICATED_PAD | CUTANEOUS | Status: DC | PRN

## 2018-02-24 MED ORDER — ZOLPIDEM TARTRATE 5 MG PO TABS
5.0000 mg | ORAL_TABLET | Freq: Every evening | ORAL | Status: DC | PRN
Start: 2018-02-24 — End: 2018-02-26

## 2018-02-24 MED ORDER — DIBUCAINE 1 % RE OINT: 1.0000 "application " | TOPICAL_OINTMENT | RECTAL | Status: DC | PRN

## 2018-02-24 MED ORDER — SENNOSIDES-DOCUSATE SODIUM 8.6-50 MG PO TABS
2.0000 | ORAL_TABLET | ORAL | Status: DC
  Administered 2018-02-24 – 2018-02-25 (×2): 2 via ORAL
  Filled 2018-02-24 (×2): qty 2

## 2018-02-24 MED ORDER — DIPHENHYDRAMINE HCL 50 MG/ML IJ SOLN: 12.5000 mg | INTRAMUSCULAR | Status: DC | PRN

## 2018-02-24 MED ORDER — FENTANYL CITRATE (PF) 100 MCG/2ML IJ SOLN
INTRAMUSCULAR | Status: AC
  Filled 2018-02-24: qty 2

## 2018-02-24 MED ORDER — ACETAMINOPHEN 325 MG PO TABS: 650.0000 mg | ORAL_TABLET | ORAL | Status: DC | PRN

## 2018-02-24 MED ORDER — MEPERIDINE HCL 25 MG/ML IJ SOLN
INTRAMUSCULAR | Status: DC | PRN
  Administered 2018-02-24: 12.5 mg via INTRAVENOUS

## 2018-02-24 MED ORDER — DEXAMETHASONE SODIUM PHOSPHATE 4 MG/ML IJ SOLN
INTRAMUSCULAR | Status: AC
  Filled 2018-02-24: qty 1

## 2018-02-24 MED ORDER — SODIUM CHLORIDE 0.9 % IR SOLN
Status: DC | PRN
  Administered 2018-02-24: 1

## 2018-02-24 MED ORDER — LIDOCAINE HCL (PF) 1 % IJ SOLN
INTRAMUSCULAR | Status: DC | PRN
  Administered 2018-02-24: 5 mL via EPIDURAL
  Administered 2018-02-24: 3 mL via EPIDURAL
  Administered 2018-02-24: 2 mL via EPIDURAL

## 2018-02-24 MED ORDER — DIPHENHYDRAMINE HCL 25 MG PO CAPS: 25.0000 mg | ORAL_CAPSULE | Freq: Four times a day (QID) | ORAL | Status: DC | PRN

## 2018-02-24 MED ORDER — ONDANSETRON HCL 4 MG PO TABS: 4.0000 mg | ORAL_TABLET | ORAL | Status: DC | PRN

## 2018-02-24 MED ORDER — OXYCODONE-ACETAMINOPHEN 5-325 MG PO TABS: 1.0000 | ORAL_TABLET | ORAL | Status: DC | PRN

## 2018-02-24 MED ORDER — ONDANSETRON HCL 4 MG/2ML IJ SOLN
INTRAMUSCULAR | Status: AC
  Filled 2018-02-24: qty 2

## 2018-02-24 MED ORDER — LACTATED RINGERS IV SOLN
INTRAVENOUS | Status: DC | PRN
  Administered 2018-02-24: 13:00:00 via INTRAVENOUS

## 2018-02-24 MED ORDER — MIDAZOLAM HCL 5 MG/5ML IJ SOLN
INTRAMUSCULAR | Status: DC | PRN
  Administered 2018-02-24 (×2): 1 mg via INTRAVENOUS

## 2018-02-24 SURGICAL SUPPLY — 20 items
BLADE SURG 11 STRL SS (BLADE) ×2 IMPLANT
CHLORAPREP W/TINT 26ML (MISCELLANEOUS) ×2 IMPLANT
DRSG OPSITE POSTOP 3X4 (GAUZE/BANDAGES/DRESSINGS) ×2 IMPLANT
GLOVE BIOGEL PI IND STRL 6.5 (GLOVE) ×1 IMPLANT
GLOVE BIOGEL PI IND STRL 7.0 (GLOVE) IMPLANT
GLOVE BIOGEL PI IND STRL 7.5 (GLOVE) IMPLANT
GLOVE BIOGEL PI INDICATOR 6.5 (GLOVE) ×2
GLOVE BIOGEL PI INDICATOR 7.0 (GLOVE) ×6
GLOVE BIOGEL PI INDICATOR 7.5 (GLOVE) ×2
GLOVE ECLIPSE 6.5 STRL STRAW (GLOVE) ×2 IMPLANT
GLOVE ECLIPSE 7.0 STRL STRAW (GLOVE) ×2 IMPLANT
GLOVE ECLIPSE 7.5 STRL STRAW (GLOVE) ×2 IMPLANT
GOWN STRL REUS W/TWL LRG LVL3 (GOWN DISPOSABLE) ×6 IMPLANT
NEEDLE HYPO 22GX1.5 SAFETY (NEEDLE) ×2 IMPLANT
PACK ABDOMINAL MINOR (CUSTOM PROCEDURE TRAY) ×3 IMPLANT
SPONGE LAP 4X18 RFD (DISPOSABLE) ×2 IMPLANT
SUT PLAIN 0 NONE (SUTURE) ×2 IMPLANT
SUT VICRYL 0 UR6 27IN ABS (SUTURE) ×2 IMPLANT
SYR CONTROL 10ML LL (SYRINGE) ×2 IMPLANT
TOWEL OR 17X24 6PK STRL BLUE (TOWEL DISPOSABLE) ×4 IMPLANT

## 2018-02-24 NOTE — H&P (Signed)
[name] is a 42 y.o. female [id number] at 37.1wks by LMP and confirmed by 8wk scan presenting for leaking fluid since 0300. Denies painful ctx or bldg; has had prodromal ctx for a few days now. [facility] and has been remarkable for 1) AMA 2) cHTN (no meds) 3) smoker 4) desires sterilization 5) Rh neg 6) +THC use in preg. GBS was collected in the office this week but is not resulted yet.  OB History    Gravida  5   Para  3   Term  3   Preterm      AB  1   Living  3     SAB  1   TAB      Ectopic      Multiple      Live Births  3          Past Medical History:  Diagnosis Date  . Abnormal Pap smear of cervix 02/05/2016  . Encounter for Nexplanon removal 01/02/2016  . GERD (gastroesophageal reflux disease)   . Hypertension   . Patient desires pregnancy 02/01/2016   Past Surgical History:  Procedure Laterality Date  . NO PAST SURGERIES     Family History: family history includes Alcoholism in her maternal grandfather; Cancer in her maternal grandmother; Cirrhosis in her maternal grandfather; Cleft lip in her son; Cleft palate in her son; Diabetes in her maternal grandmother and mother; Hypertension in her maternal aunt, maternal grandmother, maternal uncle, and mother. Social History:  reports that she has been smoking cigarettes.  She has a 5.50 pack-year smoking history. She has never used smokeless tobacco. She reports that she does not drink alcohol or use drugs.     Maternal Diabetes: No Genetic Screening: Normal Maternal Ultrasounds/Referrals: Normal Fetal Ultrasounds or other Referrals:  None Maternal Substance Abuse:  Yes:  Type: Smoker, Marijuana Significant Maternal Medications:  None Significant Maternal Lab Results:  Lab values include: Other: GBS PCR pending Other Comments:  None  ROS History Dilation: 1 Effacement (%): 40 Station: -3 Exam by:: K SHaw CNM bedside ultrasound Blood pressure 138/82, pulse  74, temperature 98.1 F (36.7 C), temperature source Oral, resp. rate 16, height 5\' 6"  (1.676 m), weight 81.2 kg (179 lb), last menstrual period 06/09/2017, SpO2 100 %. Exam Physical Exam  Constitutional: She is oriented to person, place, and time. She appears well-developed.  HENT:  Head: Normocephalic.  Neck: Normal range of motion.  Cardiovascular: Normal rate.  Respiratory: Effort normal.  GI:  EFM 140s, +accels, no decels, occ variables Irreg ctx, mild Vtx by bedside scan  Genitourinary:  Genitourinary Comments: Fern pos  Musculoskeletal: Normal range of motion.  Neurological: She is alert and oriented to person, place, and time.  Skin: Skin is warm and dry.  Psychiatric: She has a normal mood and affect. Her behavior is normal. Thought content normal.    Prenatal labs: ABO, Rh: B/Negative/-- (01/09 1144) Antibody: Negative (04/24 0910) Rubella: 1.00 (01/09 1144) RPR: Non Reactive (04/24 0910)  HBsAg: Negative (01/09 1144)  HIV: Non Reactive (04/24 0910)  GBS:   pending  Assessment/Plan: IUP@term  PROM cHTN GBS pend Rh neg  [facility] cx ripening with cytotec followed by [facility]- pt desires to get labor started Await GBS results UDS pending Anticipate SVD Plan Rhogam eval PP  and BTL if she still desires  Cam HaiSHAW, KIMBERLY CNM 02/24/2018, 6:50 AM

## 2018-02-24 NOTE — Progress Notes (Signed)
Pt BP elevated.  At 1715, the BP was 141/81.  Pt verbalized feeling stressed after a conversation with the pediatrician.  A repeat BP at 1813 was 143/80.  Pt asymptomatic.  Dr. Frederic Jerichoan Olson notified and gave no new orders at this time.  Will recheck 1 hour after the last BP.

## 2018-02-24 NOTE — Transfer of Care (Signed)
Immediate Anesthesia Transfer of Care Note  Patient: Walterine Defoor  Procedure(s) Performed: POST PARTUM TUBAL LIGATION (Bilateral )  Patient Location: PACU  Anesthesia Type:Epidural  Level of Consciousness: awake, alert , oriented and patient cooperative  Airway & Oxygen Therapy: Patient Spontanous Breathing  Post-op Assessment: Report given to RN and Post -op Vital signs reviewed and stable  Post vital signs: Reviewed and stable  Last Vitals:  Vitals Value Taken Time  BP    Temp    Pulse 90 02/24/2018  2:19 PM  Resp    SpO2 92 % 02/24/2018  2:19 PM  Vitals shown include unvalidated device data.  Last Pain:  Vitals:   02/24/18 1301  TempSrc: Oral  PainSc: 0-No pain      Patients Stated Pain Goal: 2 (02/24/18 [id number])  Complications: No apparent anesthesia complications

## 2018-02-24 NOTE — Anesthesia Preprocedure Evaluation (Signed)
Anesthesia Evaluation  Patient identified by MRN, date of birth, ID band Patient awake    Reviewed: Allergy & Precautions, NPO status , Patient's Chart, lab work & pertinent test results  Airway Mallampati: II  TM Distance: >3 FB     Dental   Pulmonary Current Smoker,    breath sounds clear to auscultation       Cardiovascular hypertension,  Rhythm:Regular Rate:Normal     Neuro/Psych negative neurological ROS     GI/Hepatic Neg liver ROS, GERD  Medicated,  Endo/Other  negative endocrine ROS  Renal/GU negative Renal ROS     Musculoskeletal   Abdominal   Peds  Hematology negative hematology ROS (+)   Anesthesia Other Findings   Reproductive/Obstetrics (+) Pregnancy                             Lab Results  Component Value Date   WBC 9.1 02/24/2018   HGB 12.0 02/24/2018   HCT 35.5 (L) 02/24/2018   MCV 94.7 02/24/2018   PLT 256 02/24/2018    Anesthesia Physical  Anesthesia Plan  ASA: II  Anesthesia Plan: Epidural   Post-op Pain Management:    Induction:   PONV Risk Score and Plan: 1 and Treatment may vary due to age or medical condition  Airway Management Planned: Natural Airway  Additional Equipment:   Intra-op Plan:   Post-operative Plan:   Informed Consent: I have reviewed the patients History and Physical, chart, labs and discussed the procedure including the risks, benefits and alternatives for the proposed anesthesia with the patient or authorized representative who has indicated his/her understanding and acceptance.     Plan Discussed with:   Anesthesia Plan Comments:         Anesthesia Quick Evaluation

## 2018-02-24 NOTE — MAU Note (Signed)
Pt reports leaking clear fluid that started about 1 hour prior to arrival. Pt reports some cramping and pressure that started afterwards. Pt denies vaginal bleeding. Reports good fetal movement. Cervix was closed on last exam.

## 2018-02-24 NOTE — Anesthesia Preprocedure Evaluation (Signed)
Anesthesia Evaluation  Patient identified by MRN, date of birth, ID band Patient awake    Reviewed: Allergy & Precautions, Patient's Chart, lab work & pertinent test results  Airway Mallampati: II  TM Distance: >3 FB     Dental   Pulmonary Current Smoker,    breath sounds clear to auscultation       Cardiovascular hypertension,  Rhythm:Regular Rate:Normal     Neuro/Psych negative neurological ROS     GI/Hepatic Neg liver ROS, GERD  Medicated,  Endo/Other  negative endocrine ROS  Renal/GU negative Renal ROS     Musculoskeletal   Abdominal   Peds  Hematology negative hematology ROS (+)   Anesthesia Other Findings   Reproductive/Obstetrics (+) Pregnancy                             Lab Results  Component Value Date   WBC 9.1 02/24/2018   HGB 12.0 02/24/2018   HCT 35.5 (L) 02/24/2018   MCV 94.7 02/24/2018   PLT 256 02/24/2018    Anesthesia Physical Anesthesia Plan  ASA: II  Anesthesia Plan: Epidural   Post-op Pain Management:    Induction:   PONV Risk Score and Plan: 1 and Treatment may vary due to age or medical condition  Airway Management Planned: Natural Airway  Additional Equipment:   Intra-op Plan:   Post-operative Plan:   Informed Consent: I have reviewed the patients History and Physical, chart, labs and discussed the procedure including the risks, benefits and alternatives for the proposed anesthesia with the patient or authorized representative who has indicated his/her understanding and acceptance.     Plan Discussed with:   Anesthesia Plan Comments:         Anesthesia Quick Evaluation

## 2018-02-24 NOTE — Anesthesia Postprocedure Evaluation (Signed)
Anesthesia Post Note  Patient: [name]  Procedure(s) Performed: POST PARTUM TUBAL LIGATION (Bilateral )     Patient location during evaluation: PACU Anesthesia Type: Epidural Level of consciousness: awake and alert Pain management: pain level controlled Vital Signs Assessment: post-procedure vital signs reviewed and stable Respiratory status: spontaneous breathing and respiratory function stable Cardiovascular status: blood pressure returned to baseline and stable Postop Assessment: epidural receding Anesthetic complications: no    Last Vitals:  Vitals:   02/24/18 1530 02/24/18 1545  BP: 107/81 119/87  Pulse: (!) 56 (!) 51  Resp: 18 18  Temp:    SpO2: 95% 95%    Last Pain:  Vitals:   02/24/18 1545  TempSrc:   PainSc: 0-No pain   Pain Goal: Patients Stated Pain Goal: 2 (02/24/18 0742)               Kennieth RadFitzgerald, Chanice Brenton E

## 2018-02-24 NOTE — Anesthesia Pain Management Evaluation Note (Signed)
  [facility] Pain Management Visit Note  Patient: [name], 42 y.o., female  "Hello I am a member of the anesthesia team at [facility]. We have an anesthesia team available at all times to provide care throughout the hospital, including epidural management and anesthesia for C-section. I don't know your plan for the delivery whether it a natural birth, water birth, IV sedation, nitrous supplementation, doula or epidural, but we want to meet your pain goals."   1.Was your pain managed to your expectations on prior hospitalizations?   Yes   2.What is your expectation for pain management during this hospitalization?     Epidural  3.How can we help you reach that goal? The patient states that she labored for about 6 hours with her last baby and she is concerned about when she should get her epidural.  Record the patient's initial score and the patient's pain goal.   Pain: 5  Pain Goal: 7 [facility] wants you to be able to say your pain was always managed very well.  Ilse Billman 02/24/2018

## 2018-02-24 NOTE — Addendum Note (Signed)
Addendum  created 02/24/18 1710 by Shanon PayorGregory, Akiel Fennell M, CRNA   Charge Capture section accepted, Sign clinical note

## 2018-02-24 NOTE — Anesthesia Procedure Notes (Signed)
Epidural Patient location during procedure: OB Start time: 02/24/2018 8:07 AM End time: 02/24/2018 8:15 AM  Staffing Anesthesiologist: Marcene DuosFitzgerald, Candance Bohlman, MD Performed: anesthesiologist   Preanesthetic Checklist Completed: patient identified, site marked, surgical consent, pre-op evaluation, timeout performed, IV checked, risks and benefits discussed and monitors and equipment checked  Epidural Patient position: sitting Prep: site prepped and draped and DuraPrep Patient monitoring: continuous pulse ox and blood pressure Approach: midline Location: L4-L5 Injection technique: LOR air  Needle:  Needle type: Tuohy  Needle gauge: 17 G Needle length: 9 cm and 9 Needle insertion depth: 8 cm Catheter type: closed end flexible Catheter size: 19 Gauge Catheter at skin depth: 14 cm Test dose: negative  Assessment Events: blood not aspirated, injection not painful, no injection resistance, negative IV test and no paresthesia

## 2018-02-24 NOTE — Anesthesia Postprocedure Evaluation (Signed)
Anesthesia Post Note  Patient: [name]  Procedure(s) Performed: POST PARTUM TUBAL LIGATION (Bilateral )     Patient location during evaluation: Mother Baby Anesthesia Type: Epidural Level of consciousness: awake and alert and oriented Pain management: satisfactory to patient Vital Signs Assessment: post-procedure vital signs reviewed and stable Respiratory status: spontaneous breathing and nonlabored ventilation Cardiovascular status: stable Postop Assessment: no headache, no backache, no signs of nausea or vomiting, adequate PO intake, patient able to bend at knees and epidural receding (patient up walking) Anesthetic complications: no    Last Vitals:  Vitals:   02/24/18 1600 02/24/18 1620  BP:  102/86  Pulse: 73 (!) 57  Resp: (!) 22 20  Temp:  (!) 36.4 C  SpO2: 99% 100%    Last Pain:  Vitals:   02/24/18 1620  TempSrc: Axillary  PainSc: 0-No pain   Pain Goal: Patients Stated Pain Goal: 2 (02/24/18 0742)               Madison HickmanGREGORY,Darleen Moffitt

## 2018-02-24 NOTE — Anesthesia Postprocedure Evaluation (Signed)
Anesthesia Post Note  Patient: [name]  Procedure(s) Performed: AN AD HOC LABOR EPIDURAL     Patient location during evaluation: Mother Baby Anesthesia Type: Epidural Level of consciousness: awake and alert and oriented Pain management: satisfactory to patient Vital Signs Assessment: post-procedure vital signs reviewed and stable Respiratory status: spontaneous breathing and nonlabored ventilation Cardiovascular status: stable Postop Assessment: no headache, no backache, no signs of nausea or vomiting, adequate PO intake, patient able to bend at knees and epidural receding (patient up walking) Anesthetic complications: no    Last Vitals:  Vitals:   02/24/18 1600 02/24/18 1620  BP:  102/86  Pulse: 73 (!) 57  Resp: (!) 22 20  Temp:  (!) 36.4 C  SpO2: 99% 100%    Last Pain:  Vitals:   02/24/18 1620  TempSrc: Axillary  PainSc: 0-No pain   Pain Goal: Patients Stated Pain Goal: 2 (02/24/18 0742)               Madison HickmanGREGORY,Jonae Renshaw

## 2018-02-24 NOTE — Progress Notes (Signed)
LABOR PROGRESS NOTE  [name] is a 42 y.o. [id number] at 4660w1d  admitted for PROM around 3 am this morning.  Subjective: Pt is now comfortable, just got epidural  Objective: BP 134/85   Pulse 63   Temp 98.1 F (36.7 C) (Oral)   Resp 18   Ht 5\' 6"  (1.676 m)   Wt 179 lb (81.2 kg)   LMP 06/09/2017   SpO2 98%   BMI 28.89 kg/m  or  Vitals:   02/24/18 0835 02/24/18 0841 02/24/18 0846 02/24/18 0901  BP: 125/71 122/70 131/71 134/85  Pulse: 67 62 62 63  Resp: 18 20 18    Temp:      TempSrc:      SpO2: 98%     Weight:      Height:        SVE: Dilation: 3 Effacement (%): 50 Cervical Position: Posterior Station: -2 Presentation: Vertex Exam by:: Valentina Lucks. Woods, RN FHT: baseline rate 140, moderate varibility, +acel, 2  3-4 minute decelerations, s/p epidural pt laying flat on her back.  Toco: ctx q 2-5 min   Assessment / Plan: 42 y.o. [id number] at 5460w1d here for PROM. Pregnancy [facility] rh negative. Also would like ppBTL  Labor: s/p cytotec at 0700, now SVE 3/50%/-2. Plan to recheck around 11 am (4 hours s/p cytotec) and start Pitocin if needed. Fetal Wellbeing:  Prolonged decels as above x 2 s/p epidural, otherwise FHT had been Cat I and reactive. Will give IF bolus and continue to monitor Pain Control:  Well-controlled now with epidural Anticipated MOD:  SVD  Frederik PearJulie P Degele, MD 02/24/2018, 9:17 AM

## 2018-02-24 NOTE — Op Note (Signed)
[name] 02/24/2018 2:41 PM  PREOPERATIVE DIAGNOSIS:  Undesired fertility  POSTOPERATIVE DIAGNOSIS:  Undesired fertility  PROCEDURE:  Postpartum Bilateral Tubal Sterilization using Pomeroy method   SURGEON: Surgeon(s) and Role:    * Wouk, Wilfred CurtisNoah Bedford, MD - Primary    * Virgen Belland, Kandra NicolasJulie P, MD - Fellow  ANESTHESIA:  Epidural  COMPLICATIONS:  None immediate.  ESTIMATED BLOOD LOSS:  Less than 20cc.  FLUIDS: 700 cc LR.  INDICATIONS: 42 y.o. yo [id number]  with undesired fertility,status post vaginal delivery, desires permanent sterilization. Risks and benefits of procedure discussed with patient including permanence of method, bleeding, infection, injury to surrounding organs and need for additional procedures. Risk failure of 0.5-1% with increased risk of ectopic gestation if pregnancy occurs was also discussed with patient.   FINDINGS:  Normal uterus, tubes, and ovaries.  TECHNIQUE: After informed consent was obtained, the patient was taken to the operating room where anesthesia was induced and found to be adequate. A small transverse, infraumbilical skin incision was made with the scalpel. This incision was carried down to the underlying layer of fascia. The fascia was tented up and entered sharply with Mayo scissors. The peritoneum was entered bluntly. The patient's left fallopian tube was then identified, brought to the incision, and grasped with a Babcock clamp. The tube was then followed out to the fimbria. The Babcock clamp was then used to grasp the tube approximately 4 cm from the cornual region. A 3 cm segment of the tube was then ligated with 2 free ties of plain gut suture, transected and excised. Good hemostasis was noted and the tube was returned to the abdomen. The right fallopian tube was then identified to its fimbriated end, ligated, and a 3 cm segment excised in a similar fashion. Excellent hemostasis was noted, and the tube returned to the abdomen. The fascia was  re-approximated with 0 Vicryl. 0.25% Marcaine solution was then injected at the incision site. The skin was closed in a subcuticular fashion with 3-0 Vicryl. The patient tolerated the procedure well. Sponge, lap, and needle count were correct x 2. The patient was taken to recovery room in stable condition.  Melaine Mcphee, Kandra NicolasJulie P, MD 02/24/2018 2:41 PM

## 2018-02-25 ENCOUNTER — Other Ambulatory Visit: Payer: Medicaid Other

## 2018-02-25 ENCOUNTER — Encounter: Payer: Medicaid Other | Admitting: Obstetrics & Gynecology

## 2018-02-25 MED ORDER — RHO D IMMUNE GLOBULIN 1500 UNIT/2ML IJ SOSY
300.0000 ug | PREFILLED_SYRINGE | Freq: Once | INTRAMUSCULAR | Status: AC
  Administered 2018-02-25: 300 ug via INTRAVENOUS
  Filled 2018-02-25: qty 2

## 2018-02-25 MED ORDER — OXYCODONE HCL 5 MG PO TABS
5.0000 mg | ORAL_TABLET | ORAL | Status: DC | PRN
  Administered 2018-02-25 – 2018-02-26 (×4): 5 mg via ORAL
  Filled 2018-02-25 (×5): qty 1

## 2018-02-25 NOTE — Progress Notes (Signed)
CSW received consult for hx of marijuana use.  Referral was screened out due to the following: ~MOB had no documented substance use after initial prenatal visit/+UPT. ~MOB had no positive drug screens after initial prenatal visit/+UPT. ~Baby's UDS is negative.  Please consult CSW if current concerns arise or by MOB's request.  CSW will monitor CDS results and make report to Child Protective Services if warranted. 

## 2018-02-25 NOTE — Progress Notes (Signed)
Patient ID: Berline ChoughLamanya Elmore, female   DOB: Nov 30, 1975, 42 y.o.   MRN: [id number] POSTPARTUM PROGRESS NOTE  Post partum Day 1 Subjective:  Doriana Bergquist is a 42 y.o. [id number] 6435w1d s/p Vacuum asisted vaginal delivery.  No acute events overnight.  Pt denies problems with ambulating, voiding or po intake.  She denies nausea or vomiting.  She reports some lower abdominal pain after her BTL. Abdominal Pain is moderately controlled.  She is taking medication for it but would like to know if there is any other medication she can take for it. She has had flatus. She has not had bowel movement.  Lochia Small.   Objective: Blood pressure 131/78, pulse (!) 53, temperature 98.1 F (36.7 C), temperature source Oral, resp. rate 18, height 5\' 6"  (1.676 m), weight 81.2 kg (179 lb), last menstrual period 06/09/2017, SpO2 100 %, unknown if currently breastfeeding.  Physical Exam:  General: alert, cooperative and no distress Lochia:normal flow Chest: no respiratory distress Heart:regular rate, distal pulses intact Incision: clean. No bleeding or sign of infection at site of BTL.  Abdomen: soft, mildly tender at suprapubic and LLQ Uterine Fundus: firm, appropriately tender DVT Evaluation: No calf swelling or tenderness Extremities: no edema  Recent Labs    02/24/18 0600 02/24/18 1635  HGB 12.0 11.2*  HCT 35.5* 33.1*    Assessment/Plan:  ASSESSMENT: Anetra Calleros is a 42 y.o. [id number] 3835w1d s/p VAVD after initial IOL for PROM and cHTN. S/p  BTL.  Pregnancy complicated by Rh- status, for which she received rhogam.  Pain is moderately controlled on NSAID and opioid PRN.   Plan for discharge tomorrow   LOS: 1 day   Jackelyn KnifeDaniel K OlsonMD 02/25/2018, 12:17 PM

## 2018-02-26 LAB — RH IG WORKUP (INCLUDES ABO/RH)
ABO/RH(D): B NEG
Fetal Screen: NEGATIVE
GESTATIONAL AGE(WKS): 37.1
Unit division: 0

## 2018-02-26 LAB — CULTURE, BETA STREP (GROUP B ONLY): Strep Gp B Culture: NEGATIVE

## 2018-02-26 MED ORDER — IBUPROFEN 600 MG PO TABS: 600.0000 mg | ORAL_TABLET | Freq: Four times a day (QID) | ORAL | 0 refills | Status: DC

## 2018-02-26 MED ORDER — ENALAPRIL MALEATE 2.5 MG PO TABS
2.5000 mg | ORAL_TABLET | Freq: Every day | ORAL | Status: DC
  Administered 2018-02-26: 2.5 mg via ORAL
  Filled 2018-02-26 (×2): qty 1

## 2018-02-26 MED ORDER — ENALAPRIL MALEATE 2.5 MG PO TABS: 2.5000 mg | ORAL_TABLET | Freq: Every day | ORAL | 0 refills | Status: DC

## 2018-02-26 MED ORDER — OXYCODONE-ACETAMINOPHEN 5-325 MG PO TABS: 1.0000 | ORAL_TABLET | Freq: Four times a day (QID) | ORAL | 0 refills | Status: DC | PRN

## 2018-02-26 NOTE — Discharge Summary (Addendum)
OB Discharge Summary    Patient Name: [name] DOB: 1976/07/27 MRN: [id number]  Date of admission: 02/24/2018 Delivering MD: Gailen Shelter   Date of discharge: 02/26/2018  Admitting diagnosis: 35WKS LEAKING FLUID Intrauterine pregnancy: [redacted]w[redacted]d    Secondary diagnosis:  Principal Problem:   PROM (premature rupture of membranes) Active Problems:   AMA (advanced maternal age) multigravida 35+   Chronic hypertension during pregnancy, antepartum   Amniotic fluid leaking   Status post vacuum-assisted vaginal delivery  Additional problems:  +THC use in pregnancy Tobacco use in pregnancy     Discharge diagnosis: Term Pregnancy Delivered and CHTN                                                                                               Post partum procedures:postpartum tubal ligation, rhogam  Augmentation: Cytotec  Complications: None  Hospital course:  Onset of Labor With Vaginal Delivery     42y.o. [id number] 373w1das admitted in Latent Labor on 02/24/2018. Patient had a labor course as follows:  Membrane Rupture Time/Date: 3:00 AM ,02/24/2018   Intrapartum Procedures: Episiotomy: None [1]                                         Lacerations:  None [1]  Patient had a delivery of a Viable infant via VAVD due to recurrent decelerations during 2nd stage of labor.  02/24/2018  Information for the patient's newborn:  EwShawntel, Farn[place]0[[id number]]Delivery Method: Vaginal, Vacuum (Extractor)(Filed from Delivery Summary)   Pateint had a postpartum course notable for intermittent elevated BP 140-150s SBP. She will receive a BP check at one week postpartum.  She is ambulating, tolerating a regular diet, passing flatus, and urinating well. Patient is discharged home in stable condition on 02/26/18.  Physical exam  Vitals:   02/25/18 0910 02/25/18 1330 02/25/18 2211 02/26/18 0626  BP: 131/78 120/69 128/75 (!) 149/76  Pulse: (!) 53 61 62 (!) 52  Resp: _0 Temp: 98.1 F  (36.7 C) 98.4 F (36.9 C) 97.9 F (36.6 C) 97.9 F (36.6 C)  TempSrc: Oral Oral Oral Oral  SpO2: 100% 99% 100%   Weight:      Height:       General: alert, cooperative and no distress Lochia: appropriate Uterine Fundus: firm Incision: Dressing is clean, dry, and intact DVT Evaluation: No evidence of DVT seen on physical exam. Labs: Lab Results  Component Value Date   WBC 11.1 (H) 02/24/2018   HGB 11.2 (L) 02/24/2018   HCT 33.1 (L) 02/24/2018   MCV 94.6 02/24/2018   PLT 217 02/24/2018   CMP Latest Ref Rng & Units 08/26/2017  Glucose 65 - 99 mg/dL 85  BUN 6 - 20 mg/dL 9  Creatinine 0.57 - 1.00 mg/dL 0.57  Sodium 134 - 144 mmol/L 137  Potassium 3.5 - 5.2 mmol/L 4.8  Chloride 96 - 106 mmol/L 102  CO2 20 - 29 mmol/L 18(L)  Calcium 8.7 - 10.2 mg/dL  10.8(H)  Total Protein 6.0 - 8.5 g/dL 7.9  Total Bilirubin 0.0 - 1.2 mg/dL 0.3  Alkaline Phos 39 - 117 IU/L 60  AST 0 - 40 IU/L 13  ALT 0 - 32 IU/L 12    Discharge instruction: per After Visit Summary and "Baby and Me Booklet".  After visit meds:  Allergies as of 02/26/2018   No Known Allergies     Medication List    STOP taking these medications   aspirin EC 81 MG tablet     TAKE these medications   acetaminophen 500 MG tablet Commonly known as:  TYLENOL Take 1,000 mg by mouth every 8 (eight) hours as needed for mild pain or headache.   ibuprofen 600 MG tablet Commonly known as:  ADVIL,MOTRIN Take 1 tablet (600 mg total) by mouth every 6 (six) hours.   oxyCODONE-acetaminophen 5-325 MG tablet Commonly known as:  PERCOCET/ROXICET Take 1-2 tablets by mouth every 6 (six) hours as needed for severe pain.   ranitidine 150 MG tablet Commonly known as:  ZANTAC Take 150 mg by mouth daily as needed for heartburn.       Diet: routine diet  Activity: Advance as tolerated. Pelvic rest for 6 weeks.   Outpatient follow up: 1-2 weeks for skin incision check, 4-6 weeks postpartum Follow up Appt:No future  appointments. Follow up Visit:No follow-ups on file.  Postpartum contraception: Tubal Ligation  Newborn Data: Live born female  Birth Weight: 5 lb 14.2 oz (2670 g) APGAR: 9, 9  Newborn Delivery   Birth date/time:  02/24/2018 12:01:00 Delivery type:  Vaginal, Vacuum (Extractor)     Baby Feeding: Bottle Disposition: pending hyperbilirubinemia evaluation  02/26/2018 Rory Percy, DO   I have spoken with and examined this patient and agree with resident/PA-S/Med-S/SNM's note and plan of care. VSS, HRR&R, Resp unlabored, Legs neg.  Nigel Berthold, CNM 02/26/2018 7:24 AM

## 2018-02-26 NOTE — Discharge Instructions (Signed)
Vaginal Delivery, Care After °Refer to this sheet in the next few weeks. These instructions provide you with information about caring for yourself after vaginal delivery. Your health care provider may also give you more specific instructions. Your treatment has been planned according to current medical practices, but problems sometimes occur. Call your health care provider if you have any problems or questions. °What can I expect after the procedure? °After vaginal delivery, it is common to have: °· Some bleeding from your vagina. °· Soreness in your abdomen, your vagina, and the area of skin between your vaginal opening and your anus (perineum). °· Pelvic cramps. °· Fatigue. ° °Follow these instructions at home: °Medicines °· Take over-the-counter and prescription medicines only as told by your health care provider. °· If you were prescribed an antibiotic medicine, take it as told by your health care provider. Do not stop taking the antibiotic until it is finished. °Driving ° °· Do not drive or operate heavy machinery while taking prescription pain medicine. °· Do not drive for 24 hours if you received a sedative. °Lifestyle °· Do not drink alcohol. This is especially important if you are breastfeeding or taking medicine to relieve pain. °· Do not use tobacco products, including cigarettes, chewing tobacco, or e-cigarettes. If you need help quitting, ask your health care provider. °Eating and drinking °· Drink at least 8 eight-ounce glasses of water every day unless you are told not to by your health care provider. If you choose to breastfeed your baby, you may need to drink more water than this. °· Eat high-fiber foods every day. These foods may help prevent or relieve constipation. High-fiber foods include: °? Whole grain cereals and breads. °? Brown rice. °? Beans. °? Fresh fruits and vegetables. °Activity °· Return to your normal activities as told by your health care provider. Ask your health care provider  what activities are safe for you. °· Rest as much as possible. Try to rest or take a nap when your baby is sleeping. °· Do not lift anything that is heavier than your baby or 10 lb (4.5 kg) until your health care provider says that it is safe. °· Talk with your health care provider about when you can engage in sexual activity. This may depend on your: °? Risk of infection. °? Rate of healing. °? Comfort and desire to engage in sexual activity. °Vaginal Care °· If you have an episiotomy or a vaginal tear, check the area every day for signs of infection. Check for: °? More redness, swelling, or pain. °? More fluid or blood. °? Warmth. °? Pus or a bad smell. °· Do not use tampons or douches until your health care provider says this is safe. °· Watch for any blood clots that may pass from your vagina. These may look like clumps of dark red, brown, or black discharge. °General instructions °· Keep your perineum clean and dry as told by your health care provider. °· Wear loose, comfortable clothing. °· Wipe from front to back when you use the toilet. °· Ask your health care provider if you can shower or take a bath. If you had an episiotomy or a perineal tear during labor and delivery, your health care provider may tell you not to take baths for a certain length of time. °· Wear a bra that supports your breasts and fits you well. °· If possible, have someone help you with household activities and help care for your baby for at least a few days after   you leave the hospital. °· Keep all follow-up visits for you and your baby as told by your health care provider. This is important. °Contact a health care provider if: °· You have: °? Vaginal discharge that has a bad smell. °? Difficulty urinating. °? Pain when urinating. °? A sudden increase or decrease in the frequency of your bowel movements. °? More redness, swelling, or pain around your episiotomy or vaginal tear. °? More fluid or blood coming from your episiotomy or  vaginal tear. °? Pus or a bad smell coming from your episiotomy or vaginal tear. °? A fever. °? A rash. °? Little or no interest in activities you used to enjoy. °? Questions about caring for yourself or your baby. °· Your episiotomy or vaginal tear feels warm to the touch. °· Your episiotomy or vaginal tear is separating or does not appear to be healing. °· Your breasts are painful, hard, or turn red. °· You feel unusually sad or worried. °· You feel nauseous or you vomit. °· You pass large blood clots from your vagina. If you pass a blood clot from your vagina, save it to show to your health care provider. Do not flush blood clots down the toilet without having your health care provider look at them. °· You urinate more than usual. °· You are dizzy or light-headed. °· You have not breastfed at all and you have not had a menstrual period for 12 weeks after delivery. °· You have stopped breastfeeding and you have not had a menstrual period for 12 weeks after you stopped breastfeeding. °Get help right away if: °· You have: °? Pain that does not go away or does not get better with medicine. °? Chest pain. °? Difficulty breathing. °? Blurred vision or spots in your vision. °? Thoughts about hurting yourself or your baby. °· You develop pain in your abdomen or in one of your legs. °· You develop a severe headache. °· You faint. °· You bleed from your vagina so much that you fill two sanitary pads in one hour. °This information is not intended to replace advice given to you by your health care provider. Make sure you discuss any questions you have with your health care provider. °Document Released: 08/01/2000 Document Revised: 01/16/2016 Document Reviewed: 08/19/2015 °Elsevier Interactive Patient Education © 2018 [employer] ° ° °Postpartum Tubal Ligation, Care After °Refer to this sheet in the next few weeks. These instructions provide you with information about caring for yourself after your procedure. Your health  care provider may also give you more specific instructions. Your treatment has been planned according to current medical practices, but problems sometimes occur. Call your health care provider if you have any problems or questions after your procedure. °What can I expect after the procedure? °After the procedure, it is common to have: °· A sore throat. °· Bruising or pain in your back. °· Nausea or vomiting. °· Dizziness. °· Mild abdominal discomfort or pain, such as cramping, gas pain, or feeling bloated. °· Soreness where the incision was made. °· Tiredness. °· Pain in your shoulders. ° °Follow these instructions at home: °Medicines °· Take over-the-counter and prescription medicines only as told by your health care provider. °· Do not take aspirin because it can cause bleeding. °· Do not drive or operate heavy machinery while taking prescription pain medicine. °Activity °· Rest for the rest of the day. °· Gradually return to your normal activities over the next few days. °· Do not have sex, douche,   or put a tampon or anything else in your vagina for 6 weeks or as long as told by your health care provider. °· Do not lift anything that is heavier than your baby for 2 weeks or as long as told by your health care provider. °Incision care °· Follow instructions from your health care provider about how to take care of your incision. Make sure you: °? Wash your hands with soap and water before you change your bandage (dressing). If soap and water are not available, use hand sanitizer. °? Change your dressing as told by your health care provider. °? Leave stitches (sutures) in place. They may need to stay in place for 2 weeks or longer. °· Check your incision area every day for signs of infection. Check for: °? More redness, swelling, or pain. °? More fluid or blood. °? Warmth. °? Pus or a bad smell. °Other Instructions °· Do not take baths, swim, or use a hot tub until your health care provider approves. You may take  showers. °· Keep all follow-up visits as told by your health care provider. This is important. °Contact a health care provider if: °· You have more redness, swelling, or pain around your incision. °· Your incision feels warm to the touch. °· You have pus or a bad smell coming from your incision. °· The edges of your incision break open after the sutures have been removed. °· Your pain does not improve after 2-3 days. °· You have a rash. °· You repeatedly become dizzy or lightheaded. °· Your pain medicine is not helping. °· You are constipated. °Get help right away if: °· You have a fever. °· You faint. °· You have pain in your abdomen that gets worse. °· You have fluid or blood coming from your sutures. °· You have shortness of breath or difficulty breathing. °· You have chest pain or leg pain. °· You have ongoing nausea or diarrhea. °This information is not intended to replace advice given to you by your health care provider. Make sure you discuss any questions you have with your health care provider. °Document Released: 02/03/2012 Document Revised: 01/07/2016 Document Reviewed: 07/15/2015 °Elsevier Interactive Patient Education © 2018 [employer] ° °

## 2018-02-27 LAB — TYPE AND SCREEN
ABO/RH(D): B NEG
ANTIBODY SCREEN: POSITIVE
Unit division: 0
Unit division: 0

## 2018-02-27 LAB — BPAM RBC
Blood Product Expiration Date: 201907192359
Blood Product Expiration Date: 201907192359
ISSUE DATE / TIME: 201907011416
Unit Type and Rh: 9500
Unit Type and Rh: 9500

## 2018-03-01 ENCOUNTER — Other Ambulatory Visit: Payer: Medicaid Other | Admitting: Obstetrics and Gynecology

## 2018-03-01 ENCOUNTER — Encounter (HOSPITAL_COMMUNITY): Payer: Self-pay

## 2018-03-02 ENCOUNTER — Ambulatory Visit: Payer: Medicaid Other | Admitting: Advanced Practice Midwife

## 2018-03-02 ENCOUNTER — Encounter: Payer: Self-pay | Admitting: *Deleted

## 2018-03-02 VITALS — BP 187/89 | HR 45 | Ht 66.0 in | Wt 171.2 lb

## 2018-03-02 DIAGNOSIS — Z0130 Encounter for examination of blood pressure without abnormal findings: Secondary | ICD-10-CM

## 2018-03-02 MED ORDER — ENALAPRIL MALEATE 10 MG PO TABS: 10.0000 mg | ORAL_TABLET | Freq: Every day | ORAL | 1 refills | Status: AC

## 2018-03-02 NOTE — Progress Notes (Signed)
Pt here for BP check following SVD/CHTN.  DC's on vasotec 2.5mg .  Vitals:   03/02/18 1424 03/02/18 1431  BP: (!) 188/89 (!) 187/89  Pulse: (!) 42 (!) 45   Has not taken BP med today C/O HA, dizzy today    185/84   Discussed w/LHE.  Increase vasotec to 10mg /daily, take dose ASAP at home.   Has pain at epidural site Take ibuprofen  F/U Friday for BP check.

## 2018-03-02 NOTE — Patient Instructions (Signed)
Take a total of 10 mg of Vasotec (four 2.5mg  tablets)

## 2018-03-04 ENCOUNTER — Encounter: Payer: Medicaid Other | Admitting: Women's Health

## 2018-03-04 ENCOUNTER — Other Ambulatory Visit: Payer: Medicaid Other

## 2018-03-05 ENCOUNTER — Other Ambulatory Visit: Payer: Self-pay

## 2018-03-05 ENCOUNTER — Encounter: Payer: Self-pay | Admitting: Obstetrics & Gynecology

## 2018-03-05 ENCOUNTER — Other Ambulatory Visit: Payer: Self-pay | Admitting: Obstetrics & Gynecology

## 2018-03-05 ENCOUNTER — Ambulatory Visit (INDEPENDENT_AMBULATORY_CARE_PROVIDER_SITE_OTHER): Payer: Medicaid Other | Admitting: Obstetrics & Gynecology

## 2018-03-05 VITALS — BP 168/85 | HR 51

## 2018-03-05 DIAGNOSIS — Z0130 Encounter for examination of blood pressure without abnormal findings: Secondary | ICD-10-CM

## 2018-03-05 MED ORDER — TRIAMTERENE-HCTZ 37.5-25 MG PO CAPS: 1.0000 | ORAL_CAPSULE | Freq: Every day | ORAL | 2 refills | Status: AC

## 2018-03-30 ENCOUNTER — Encounter: Payer: Self-pay | Admitting: Advanced Practice Midwife

## 2018-03-30 ENCOUNTER — Other Ambulatory Visit: Payer: Self-pay

## 2018-03-30 ENCOUNTER — Ambulatory Visit (INDEPENDENT_AMBULATORY_CARE_PROVIDER_SITE_OTHER): Payer: Medicaid Other | Admitting: Advanced Practice Midwife

## 2018-03-30 DIAGNOSIS — R87612 Low grade squamous intraepithelial lesion on cytologic smear of cervix (LGSIL): Secondary | ICD-10-CM

## 2018-03-30 NOTE — Progress Notes (Signed)
[name] is a 42 y.o. who presents for a postpartum visit. She is 4 weeks postpartum following a VAD . I have fully reviewed the prenatal and intrapartum course. The delivery was at  gestational weeks.  Anesthesia: epidural. Postpartum course has been uneventful. She was on Vasotec for a few weeks. Stopped 4 days ago.  Back no longer sore. Baby's course has been uneventful. Baby is feeding by bottle. Bleeding: no bleeding. Bowel function is normal. Bladder function is normal. Patient is sexually active. Contraception method is tubal ligation. Postpartum depression screening: negative. Had some crying spells for a week or so, over that now.    Current Outpatient Medications:  .  enalapril (VASOTEC) 10 MG tablet, Take 1 tablet (10 mg total) by mouth daily. (Patient not taking: Reported on 03/30/2018), Disp: 30 tablet, Rfl: 1 .  triamterene-hydrochlorothiazide (DYAZIDE) 37.5-25 MG capsule, Take 1 each (1 capsule total) by mouth daily. (Patient not taking: Reported on 03/30/2018), Disp: 30 capsule, Rfl: 2  Review of Systems   Constitutional: Negative for fever and chills Eyes: Negative for visual disturbances Respiratory: Negative for shortness of breath, dyspnea Cardiovascular: Negative for chest pain or palpitations  Gastrointestinal: Negative for vomiting, diarrhea and nstipation Genitourinary: Negative for dysuria and urgency Musculoskeletal: Negative for back pain, joint pain, myalgias  Neurological: Negative for dizziness and headaches    Objective:     Vitals:   03/30/18 1105  BP: 102/63   General:  alert, cooperative and no distress   Breasts:  negative  Lungs: Normal respiratory effort  Heart:  regular rate and rhythm  Abdomen: Soft, nontender   Vulva:  normal  Vagina: normal vagina  Cervix:  closed  Corpus: Well involuted     Rectal Exam: no hemorrhoids        Assessment:    normal postpartum exam.  Plan:   1. Contraception: tubal ligation 2. Follow up in: Feb  for pap (need 2 more normals before going to q 3 years)

## 2018-04-08 ENCOUNTER — Encounter: Payer: Self-pay | Admitting: Obstetrics & Gynecology

## 2018-04-08 NOTE — Progress Notes (Signed)
No chief complaint on file.     42 y.o. [id number] No LMP recorded. The current method of family planning is post partum.  Outpatient Encounter Medications as of 03/05/2018  Medication Sig  . enalapril (VASOTEC) 10 MG tablet Take 1 tablet (10 mg total) by mouth daily. (Patient not taking: Reported on 03/30/2018)  . [DISCONTINUED] acetaminophen (TYLENOL) 500 MG tablet Take 1,000 mg by mouth every 8 (eight) hours as needed for mild pain or headache.   . [DISCONTINUED] ibuprofen (ADVIL,MOTRIN) 600 MG tablet Take 1 tablet (600 mg total) by mouth every 6 (six) hours.  . [DISCONTINUED] oxyCODONE-acetaminophen (PERCOCET/ROXICET) 5-325 MG tablet Take 1-2 tablets by mouth every 6 (six) hours as needed for severe pain.  . [DISCONTINUED] ranitidine (ZANTAC) 150 MG tablet Take 150 mg by mouth daily as needed for heartburn.    No facility-administered encounter medications on file as of 03/05/2018.     Subjective [name] in for BP check, on vasotec 10 mg daily No headaches or visual changes Past Medical History:  Diagnosis Date  . Abnormal Pap smear of cervix 02/05/2016  . Encounter for Nexplanon removal 01/02/2016  . GERD (gastroesophageal reflux disease)   . Hypertension   . Patient desires pregnancy 02/01/2016    Past Surgical History:  Procedure Laterality Date  . NO PAST SURGERIES    . TUBAL LIGATION Bilateral 02/24/2018   Procedure: POST PARTUM TUBAL LIGATION;  Surgeon: Kathrynn Running, MD;  Location: [facility] BIRTHING SUITES;  Service: Gynecology;  Laterality: Bilateral;    OB History    Gravida  5   Para  4   Term  4   Preterm      AB  1   Living  4     SAB  1   TAB      Ectopic      Multiple  0   Live Births  4           No Known Allergies  Social History   Socioeconomic History  . Marital status: Married    Spouse name: Not on file  . Number of children: Not on file  . Years of education: Not on file  . Highest education level: Not on file    Occupational History  . Not on file  Social Needs  . Financial resource strain: Not on file  . Food insecurity:    Worry: Not on file    Inability: Not on file  . Transportation needs:    Medical: Not on file    Non-medical: Not on file  Tobacco Use  . Smoking status: Current Every Day Smoker    Packs/day: 0.25    Years: 22.00    Pack years: 5.50    Types: Cigarettes  . Smokeless tobacco: Never Used  Substance and Sexual Activity  . Alcohol use: Yes    Alcohol/week: 0.0 standard drinks    Frequency: Never    Comment: denies use 08/01/17  . Drug use: Yes    Types: Marijuana    Comment:  denies use 08/01/17  . Sexual activity: Yes    Birth control/protection: None  Lifestyle  . Physical activity:    Days per week: Not on file    Minutes per session: Not on file  . Stress: Not on file  Relationships  . Social connections:    Talks on phone: Not on file    Gets together: Not on file    Attends religious service: Not  on file    Active member of club or organization: Not on file    Attends meetings of clubs or organizations: Not on file    Relationship status: Not on file  Other Topics Concern  . Not on file  Social History Narrative  . Not on file    Family History  Problem Relation Age of Onset  . Cancer Maternal Grandmother   . Diabetes Maternal Grandmother   . Hypertension Maternal Grandmother   . Alcoholism Maternal Grandfather   . Cirrhosis Maternal Grandfather   . Diabetes Mother   . Hypertension Mother   . Cleft lip Son   . Cleft palate Son   . Hypertension Maternal Aunt   . Hypertension Maternal Uncle     Medications:       Current Outpatient Medications:  .  enalapril (VASOTEC) 10 MG tablet, Take 1 tablet (10 mg total) by mouth daily. (Patient not taking: Reported on 03/30/2018), Disp: 30 tablet, Rfl: 1 .  triamterene-hydrochlorothiazide (DYAZIDE) 37.5-25 MG capsule, Take 1 each (1 capsule total) by mouth daily. (Patient not taking: Reported on  03/30/2018), Disp: 30 capsule, Rfl: 2  Objective Blood pressure (!) 168/85, pulse (!) 51, not currently breastfeeding.    Pertinent ROS   Labs or studies     Impression Diagnoses this Encounter::   ICD-10-CM   1. BP check Z01.30     Established relevant diagnosis(es):   Plan/Recommendations: No orders of the defined types were placed in this encounter.   Labs or Scans Ordered: No orders of the defined types were placed in this encounter.   Management:: Continue vasotec 10 mg daily  Follow up Return in about 2 weeks (around 03/19/2018) for Follow up.    .   All questions were answered.

## 2019-04-28 ENCOUNTER — Emergency Department (HOSPITAL_COMMUNITY)
Admission: EM | Admit: 2019-04-28 | Discharge: 2019-04-28 | Disposition: A | Discharge status: Home / Self Care | Payer: Medicaid Other | Attending: Emergency Medicine | Admitting: Emergency Medicine

## 2019-04-28 ENCOUNTER — Other Ambulatory Visit: Payer: Self-pay

## 2019-04-28 ENCOUNTER — Encounter (HOSPITAL_COMMUNITY): Payer: Self-pay | Admitting: Emergency Medicine

## 2019-04-28 DIAGNOSIS — T7840XA Allergy, unspecified, initial encounter: Secondary | ICD-10-CM | POA: Insufficient documentation

## 2019-04-28 DIAGNOSIS — Z5321 Procedure and treatment not carried out due to patient leaving prior to being seen by health care provider: Secondary | ICD-10-CM | POA: Insufficient documentation

## 2019-04-28 DIAGNOSIS — R21 Rash and other nonspecific skin eruption: Secondary | ICD-10-CM | POA: Insufficient documentation

## 2019-04-28 NOTE — ED Triage Notes (Signed)
Patient with rash to her hands. States she thinks she "has been poisoned" by hand sanitizer.

## 2019-09-28 ENCOUNTER — Ambulatory Visit: Payer: Self-pay | Attending: Internal Medicine

## 2019-09-28 ENCOUNTER — Other Ambulatory Visit: Payer: Self-pay

## 2019-09-28 DIAGNOSIS — Z20822 Contact with and (suspected) exposure to covid-19: Secondary | ICD-10-CM

## 2019-09-29 LAB — NOVEL CORONAVIRUS, NAA: SARS-CoV-2, NAA: NOT DETECTED

## 2019-11-16 ENCOUNTER — Encounter (HOSPITAL_COMMUNITY): Payer: Self-pay | Admitting: Emergency Medicine

## 2019-11-16 ENCOUNTER — Emergency Department (HOSPITAL_COMMUNITY)
Admission: EM | Admit: 2019-11-16 | Discharge: 2019-11-16 | Disposition: A | Discharge status: Home / Self Care | Payer: Medicaid Other | Attending: Emergency Medicine | Admitting: Emergency Medicine

## 2019-11-16 ENCOUNTER — Other Ambulatory Visit: Payer: Self-pay

## 2019-11-16 DIAGNOSIS — L309 Dermatitis, unspecified: Secondary | ICD-10-CM | POA: Insufficient documentation

## 2019-11-16 DIAGNOSIS — F17210 Nicotine dependence, cigarettes, uncomplicated: Secondary | ICD-10-CM | POA: Insufficient documentation

## 2019-11-16 DIAGNOSIS — F1210 Cannabis abuse, uncomplicated: Secondary | ICD-10-CM | POA: Insufficient documentation

## 2019-11-16 DIAGNOSIS — I10 Essential (primary) hypertension: Secondary | ICD-10-CM | POA: Insufficient documentation

## 2019-11-16 MED ORDER — PREDNISONE 10 MG PO TABS: ORAL_TABLET | ORAL | 0 refills | Status: AC

## 2019-11-16 MED ORDER — PREDNISONE 50 MG PO TABS
60.0000 mg | ORAL_TABLET | Freq: Once | ORAL | Status: AC
  Administered 2019-11-16: 60 mg via ORAL
  Filled 2019-11-16: qty 1

## 2019-11-16 MED ORDER — HYDROXYZINE HCL 25 MG PO TABS: 25.0000 mg | ORAL_TABLET | Freq: Four times a day (QID) | ORAL | 0 refills | Status: AC | PRN

## 2019-11-16 NOTE — Discharge Instructions (Addendum)
Take the medication as directed.  It is also important that you avoid scratching and use a heavy moisturizer on your skin after bathing.  You may also apply heavy moisturizer and wear cotton gloves at night.  Call the dermatologist listed to arrange a follow-up appointment.

## 2019-11-16 NOTE — ED Triage Notes (Signed)
Patient has rash bilaterally to hands, body, started using hand sanitizer last year during COVID and has been breaking out ever since.

## 2019-11-18 NOTE — ED Provider Notes (Signed)
[facility] EMERGENCY DEPARTMENT Provider Note   CSN: [id number] Arrival date & time: 11/16/19  1501     History Chief Complaint  Patient presents with  . Rash    [name] is a 44 y.o. female.  HPI      [name] is a 44 y.o. female who presents to the Emergency Department requesting evaluation of itching and dry, scaly rash to bilateral hands, neck, scalp, and legs.  Rash has been gradually worsening for nearly one year.  She states the rash began on her hands after frequently using hand sanitizer.  She denies swelling, bleeding, fever, chills.  No alleviating factors   Past Medical History:  Diagnosis Date  . Abnormal Pap smear of cervix 02/05/2016  . Encounter for Nexplanon removal 01/02/2016  . GERD (gastroesophageal reflux disease)   . Hypertension   . Patient desires pregnancy 02/01/2016    Patient Active Problem List   Diagnosis Date Noted  . Marijuana use 09/10/2017  . Smoker 08/26/2017  . Chronic hypertension during pregnancy, antepartum 08/26/2017  . Family history of cleft lip 08/26/2017  . Abnormal Pap smear of cervix 02/05/2016    Past Surgical History:  Procedure Laterality Date  . NO PAST SURGERIES    . TUBAL LIGATION Bilateral 02/24/2018   Procedure: POST PARTUM TUBAL LIGATION;  Surgeon: Kathrynn Running, MD;  Location: [facility] BIRTHING SUITES;  Service: Gynecology;  Laterality: Bilateral;     OB History    Gravida  5   Para  4   Term  4   Preterm      AB  1   Living  4     SAB  1   TAB      Ectopic      Multiple  0   Live Births  4           Family History  Problem Relation Age of Onset  . Cancer Maternal Grandmother   . Diabetes Maternal Grandmother   . Hypertension Maternal Grandmother   . Alcoholism Maternal Grandfather   . Cirrhosis Maternal Grandfather   . Diabetes Mother   . Hypertension Mother   . Cleft lip Son   . Cleft palate Son   . Hypertension Maternal Aunt   . Hypertension Maternal Uncle      Social History   Tobacco Use  . Smoking status: Current Every Day Smoker    Packs/day: 0.25    Years: 22.00    Pack years: 5.50    Types: Cigarettes  . Smokeless tobacco: Never Used  Substance Use Topics  . Alcohol use: Yes    Alcohol/week: 0.0 standard drinks    Comment: denies use 08/01/17  . Drug use: Yes    Types: Marijuana    Comment:  denies use 08/01/17    Home Medications Prior to Admission medications   Medication Sig Start Date End Date Taking? Authorizing Provider  enalapril (VASOTEC) 10 MG tablet Take 1 tablet (10 mg total) by mouth daily. Patient not taking: Reported on 03/30/2018 03/02/18   Cresenzo-Dishmon, Scarlette Calico, CNM  hydrOXYzine (ATARAX/VISTARIL) 25 MG tablet Take 1 tablet (25 mg total) by mouth every 6 (six) hours as needed for itching. May cause drowsiness 11/16/19   Zykerria Tanton, PA-C  predniSONE (DELTASONE) 10 MG tablet Take 6 tablets day one, 5 tablets day two, 4 tablets day three, 3 tablets day four, 2 tablets day five, then 1 tablet day six 11/16/19   Kerrigan Glendening, PA-C  triamterene-hydrochlorothiazide (DYAZIDE) 37.5-25 MG  capsule Take 1 each (1 capsule total) by mouth daily. Patient not taking: Reported on 03/30/2018 03/05/18   Lazaro Arms, MD    Allergies    Patient has no known allergies.  Review of Systems   Review of Systems  Constitutional: Negative for activity change, appetite change, chills and fever.  HENT: Negative for facial swelling, sore throat and trouble swallowing.   Respiratory: Negative for chest tightness, shortness of breath and wheezing.   Cardiovascular: Negative for chest pain.  Musculoskeletal: Negative for neck pain and neck stiffness.  Skin: Positive for rash. Negative for wound.  Neurological: Negative for dizziness, weakness, numbness and headaches.    Physical Exam Updated Vital Signs BP (!) 180/77   Pulse 64   Temp 99.1 F (37.3 C) (Oral)   Resp 20   Ht 5\' 6"  (1.676 m)   Wt 81.6 kg   LMP 11/06/2019    SpO2 100%   BMI 29.05 kg/m   Physical Exam Vitals and nursing note reviewed.  Constitutional:      General: She is not in acute distress.    Appearance: Normal appearance. She is well-developed. She is not ill-appearing or toxic-appearing.  HENT:     Head: Normocephalic and atraumatic.     Mouth/Throat:     Mouth: Mucous membranes are moist.     Comments: No oral lesions Cardiovascular:     Rate and Rhythm: Normal rate and regular rhythm.     Pulses: Normal pulses.  Pulmonary:     Effort: Pulmonary effort is normal. No respiratory distress.     Breath sounds: Normal breath sounds.  Musculoskeletal:        General: No tenderness. Normal range of motion.     Cervical back: Normal range of motion and neck supple.     Right lower leg: No edema.     Left lower leg: No edema.  Lymphadenopathy:     Cervical: No cervical adenopathy.  Skin:    General: Skin is warm.     Capillary Refill: Capillary refill takes less than 2 seconds.     Findings: Erythema and rash present.     Comments:  blistery rash with lichenification to bilateral hands including palms, fingers, neck and scalp.  Patchy alopecia of the scalp. No edema   Neurological:     Mental Status: She is alert and oriented to person, place, and time.     Motor: No abnormal muscle tone.     Coordination: Coordination normal.     ED Results / Procedures / Treatments   Labs (all labs ordered are listed, but only abnormal results are displayed) Labs Reviewed - No data to display  EKG None  Radiology No results found.  Procedures Procedures (including critical care time)  Medications Ordered in ED Medications  predniSONE (DELTASONE) tablet 60 mg (60 mg Oral Given 11/16/19 1736)    ED Course  I have reviewed the triage vital signs and the nursing notes.  Pertinent labs & imaging results that were available during my care of the patient were reviewed by me and considered in my medical decision making (see chart for  details).    MDM Rules/Calculators/A&P                      Pt well appearing chronic rash that appears c/w dyshidrotic eczema.  Pt agrees to tx plan and close f/u with dermatology.  Will start steroid, but I have explained that rash often requires long term  treatment and she will likely need f/u.  She verbalized understanding   Final Clinical Impression(s) / ED Diagnoses Final diagnoses:  Eczema, unspecified type    Rx / DC Orders ED Discharge Orders         Ordered    predniSONE (DELTASONE) 10 MG tablet     11/16/19 1728    hydrOXYzine (ATARAX/VISTARIL) 25 MG tablet  Every 6 hours PRN     11/16/19 1728           Kem Parkinson, PA-C 11/18/19 2303    Milton Ferguson, MD 11/21/19 1643

## 2023-09-13 ENCOUNTER — Emergency Department (HOSPITAL_COMMUNITY)
Admission: EM | Admit: 2023-09-13 | Discharge: 2023-09-14 | Disposition: A | Discharge status: Home / Self Care | Payer: Medicaid Other | Attending: Emergency Medicine | Admitting: Emergency Medicine

## 2023-09-13 ENCOUNTER — Other Ambulatory Visit: Payer: Self-pay

## 2023-09-13 ENCOUNTER — Encounter (HOSPITAL_COMMUNITY): Payer: Self-pay

## 2023-09-13 DIAGNOSIS — I10 Essential (primary) hypertension: Secondary | ICD-10-CM | POA: Insufficient documentation

## 2023-09-13 DIAGNOSIS — J101 Influenza due to other identified influenza virus with other respiratory manifestations: Secondary | ICD-10-CM | POA: Insufficient documentation

## 2023-09-13 DIAGNOSIS — Z20822 Contact with and (suspected) exposure to covid-19: Secondary | ICD-10-CM | POA: Diagnosis not present

## 2023-09-13 DIAGNOSIS — F17210 Nicotine dependence, cigarettes, uncomplicated: Secondary | ICD-10-CM | POA: Diagnosis not present

## 2023-09-13 DIAGNOSIS — R112 Nausea with vomiting, unspecified: Secondary | ICD-10-CM | POA: Diagnosis present

## 2023-09-13 LAB — RESP PANEL BY RT-PCR (RSV, FLU A&B, COVID)  RVPGX2
Influenza A by PCR: POSITIVE — AB
Influenza B by PCR: NEGATIVE
Resp Syncytial Virus by PCR: NEGATIVE
SARS Coronavirus 2 by RT PCR: NEGATIVE

## 2023-09-13 MED ORDER — KETOROLAC TROMETHAMINE 15 MG/ML IJ SOLN
15.0000 mg | Freq: Once | INTRAMUSCULAR | Status: AC
  Administered 2023-09-13: 15 mg via INTRAVENOUS
  Filled 2023-09-13: qty 1

## 2023-09-13 MED ORDER — SODIUM CHLORIDE 0.9 % IV BOLUS
1000.0000 mL | Freq: Once | INTRAVENOUS | Status: AC
  Administered 2023-09-13: 1000 mL via INTRAVENOUS

## 2023-09-13 MED ORDER — ONDANSETRON HCL 4 MG/2ML IJ SOLN
4.0000 mg | Freq: Once | INTRAMUSCULAR | Status: AC
  Administered 2023-09-13: 4 mg via INTRAVENOUS
  Filled 2023-09-13: qty 2

## 2023-09-13 NOTE — ED Triage Notes (Signed)
Pt reports cough, vomiting and body aches onset today.

## 2023-09-13 NOTE — ED Provider Notes (Signed)
[facility]-EMERGENCY DEPT [facility] Emergency Department Provider Note MRN:  [id number]  Arrival date & time: 09/14/23     Chief Complaint   flu like symptoms   History of Present Illness   [name] is a 48 y.o. year-old female with a history of hypertension presenting to the ED with chief complaint of flulike symptoms.  Chills, body aches starting today.  Persistent nausea vomiting all day today, feels dehydrated, cannot keep anything down.  Occasional abdominal discomfort.  Cough.  Low back pain.  No numbness or weakness to the arms or legs, no bowel or bladder dysfunction.  Review of Systems  A thorough review of systems was obtained and all systems are negative except as noted in the HPI and PMH.   Patient's Health History    Past Medical History:  Diagnosis Date   Abnormal Pap smear of cervix 02/05/2016   Encounter for Nexplanon removal 01/02/2016   GERD (gastroesophageal reflux disease)    Hypertension    Patient desires pregnancy 02/01/2016    Past Surgical History:  Procedure Laterality Date   NO PAST SURGERIES     TUBAL LIGATION Bilateral 02/24/2018   Procedure: POST PARTUM TUBAL LIGATION;  Surgeon: Kathrynn Running, MD;  Location: [facility] BIRTHING SUITES;  Service: Gynecology;  Laterality: Bilateral;    Family History  Problem Relation Age of Onset   Cancer Maternal Grandmother    Diabetes Maternal Grandmother    Hypertension Maternal Grandmother    Alcoholism Maternal Grandfather    Cirrhosis Maternal Grandfather    Diabetes Mother    Hypertension Mother    Cleft lip Son    Cleft palate Son    Hypertension Maternal Aunt    Hypertension Maternal Uncle     Social History   Socioeconomic History   Marital status: Married    Spouse name: Not on file   Number of children: Not on file   Years of education: Not on file   Highest education level: Not on file  Occupational History   Not on file  Tobacco Use   Smoking status: Every Day    Current packs/day:  0.25    Average packs/day: 0.3 packs/day for 22.0 years (5.5 ttl pk-yrs)    Types: Cigarettes   Smokeless tobacco: Never  Vaping Use   Vaping status: Never Used  Substance and Sexual Activity   Alcohol use: Yes    Comment: every other day   Drug use: Yes    Types: Marijuana   Sexual activity: Yes    Birth control/protection: None  Other Topics Concern   Not on file  Social History Narrative   Not on file   Social Drivers of Health   Financial Resource Strain: Not on file  Food Insecurity: Not on file  Transportation Needs: Not on file  Physical Activity: Not on file  Stress: Not on file  Social Connections: Not on file  Intimate Partner Violence: Not on file     Physical Exam   Vitals:   09/13/23 2142  BP: (!) 156/103  Pulse: 84  Resp: 18  Temp: (!) 100.5 F (38.1 C)  SpO2: 97%    CONSTITUTIONAL: Well-appearing, NAD NEURO/PSYCH:  Alert and oriented x 3, no focal deficits EYES:  eyes equal and reactive ENT/NECK:  no LAD, no JVD CARDIO: Regular rate, well-perfused, normal S1 and S2 PULM:  CTAB no wheezing or rhonchi GI/GU:  non-distended, non-tender MSK/SPINE:  No gross deformities, no edema SKIN:  no rash, atraumatic   *Additional and/or  pertinent findings included in MDM below  Diagnostic and Interventional Summary    EKG Interpretation Date/Time:    Ventricular Rate:    PR Interval:    QRS Duration:    QT Interval:    QTC Calculation:   R Axis:      Text Interpretation:         Labs Reviewed  RESP PANEL BY RT-PCR (RSV, FLU A&B, COVID)  RVPGX2 - Abnormal; Notable for the following components:      Result Value   Influenza A by PCR POSITIVE (*)    All other components within normal limits  CBC - Abnormal; Notable for the following components:   RBC 3.58 (*)    MCV 105.0 (*)    MCH 36.0 (*)    All other components within normal limits  BASIC METABOLIC PANEL - Abnormal; Notable for the following components:   Glucose, Bld 100 (*)    All  other components within normal limits    No orders to display    Medications  sodium chloride 0.9 % bolus 1,000 mL (1,000 mLs Intravenous Bolus 09/13/23 2354)  ondansetron (ZOFRAN) injection 4 mg (4 mg Intravenous Given 09/13/23 2355)  ketorolac (TORADOL) 15 MG/ML injection 15 mg (15 mg Intravenous Given 09/13/23 2355)     Procedures  /  Critical Care Procedures  ED Course and Medical Decision Making  Initial Impression and Ddx Constellation of symptoms is most suggestive of viral illness.  Patient has tested positive for flu.  A lot of nausea vomiting today, feels dehydrated, oral rehydration was suggested but patient feels too unwell for this, will provide a bolus of IV fluids and check for electrolyte dysfunction or AKI with basic laboratory assessment.  Past medical/surgical history that increases complexity of ED encounter: None  Interpretation of Diagnostics I personally reviewed the laboratory assessment and my interpretation is as follows: No significant blood count or electrolyte disturbance    Patient Reassessment and Ultimate Disposition/Management     Discharge  Patient management required discussion with the following services or consulting groups:  None  Complexity of Problems Addressed Acute illness or injury that poses threat of life of bodily function  Additional Data Reviewed and Analyzed Further history obtained from: Further history from spouse/family member  Additional Factors Impacting ED Encounter Risk Prescriptions  Elmer Sow. Pilar Plate, MD [facility] Emergency Medicine [facility] [email or web address]  Final Clinical Impressions(s) / ED Diagnoses     ICD-10-CM   1. Influenza A  J10.1       ED Discharge Orders          Ordered    ondansetron (ZOFRAN-ODT) 4 MG disintegrating tablet  Every 8 hours PRN        09/14/23 0058    oseltamivir (TAMIFLU) 75 MG capsule  Every 12 hours        09/14/23 0058    naproxen (NAPROSYN) 500 MG  tablet  2 times daily        09/14/23 0058             Discharge Instructions Discussed with and Provided to Patient:     Discharge Instructions      You were evaluated in the Emergency Department and after careful evaluation, we did not find any emergent condition requiring admission or further testing in the hospital.  Your exam/testing today is overall reassuring.  Your symptoms seem to be due to the flu.  Use the Zofran as needed for nausea, use the Naprosyn twice  daily for discomfort, use the Tamiflu to help you get better faster.  Plenty of fluids and rest.  Please return to the Emergency Department if you experience any worsening of your condition.   Thank you for allowing [place] to be a part of your care.       Sabas Sous, MD 09/14/23 [phone]

## 2023-09-14 LAB — CBC
HCT: 37.6 % (ref 36.0–46.0)
Hemoglobin: 12.9 g/dL (ref 12.0–15.0)
MCH: 36 pg — ABNORMAL HIGH (ref 26.0–34.0)
MCHC: 34.3 g/dL (ref 30.0–36.0)
MCV: 105 fL — ABNORMAL HIGH (ref 80.0–100.0)
Platelets: 252 10*3/uL (ref 150–400)
RBC: 3.58 MIL/uL — ABNORMAL LOW (ref 3.87–5.11)
RDW: 12.3 % (ref 11.5–15.5)
WBC: 6.7 10*3/uL (ref 4.0–10.5)
nRBC: 0 % (ref 0.0–0.2)

## 2023-09-14 LAB — BASIC METABOLIC PANEL
Anion gap: 12 (ref 5–15)
BUN: 9 mg/dL (ref 6–20)
CO2: 25 mmol/L (ref 22–32)
Calcium: 9.3 mg/dL (ref 8.9–10.3)
Chloride: 100 mmol/L (ref 98–111)
Creatinine, Ser: 0.86 mg/dL (ref 0.44–1.00)
GFR, Estimated: >60 mL/min (ref >60)
Glucose, Bld: 100 mg/dL — ABNORMAL HIGH (ref 70–99)
Potassium: 3.7 mmol/L (ref 3.5–5.1)
Sodium: 137 mmol/L (ref 135–145)

## 2023-09-14 MED ORDER — ONDANSETRON 4 MG PO TBDP: 4.0000 mg | ORAL_TABLET | Freq: Three times a day (TID) | ORAL | 0 refills | Status: AC | PRN

## 2023-09-14 MED ORDER — OSELTAMIVIR PHOSPHATE 75 MG PO CAPS: 75.0000 mg | ORAL_CAPSULE | Freq: Two times a day (BID) | ORAL | 0 refills | Status: AC

## 2023-09-14 MED ORDER — NAPROXEN 500 MG PO TABS: 500.0000 mg | ORAL_TABLET | Freq: Two times a day (BID) | ORAL | 0 refills | Status: AC

## 2023-09-14 NOTE — Discharge Instructions (Signed)
You were evaluated in the Emergency Department and after careful evaluation, we did not find any emergent condition requiring admission or further testing in the hospital.  Your exam/testing today is overall reassuring.  Your symptoms seem to be due to the flu.  Use the Zofran as needed for nausea, use the Naprosyn twice daily for discomfort, use the Tamiflu to help you get better faster.  Plenty of fluids and rest.  Please return to the Emergency Department if you experience any worsening of your condition.   Thank you for allowing [place] to be a part of your care.
